# Patient Record
Sex: Female | Born: 1970 | Race: Black or African American | Hispanic: No | Marital: Married | Smoking: Never smoker
Health system: Southern US, Community
[De-identification: ages and names within clinical notes are randomized; demographics above are authoritative.]

---

## 2017-11-12 ENCOUNTER — Emergency Department (HOSPITAL_COMMUNITY): Payer: BLUE CROSS/BLUE SHIELD

## 2017-11-12 ENCOUNTER — Other Ambulatory Visit: Payer: Self-pay

## 2017-11-12 ENCOUNTER — Inpatient Hospital Stay (HOSPITAL_COMMUNITY)
Admission: EM | Admit: 2017-11-12 | Discharge: 2017-11-16 | DRG: 084 | Disposition: A | Discharge status: Home / Self Care | Payer: BLUE CROSS/BLUE SHIELD | Attending: Neurosurgery | Admitting: Neurosurgery

## 2017-11-12 DIAGNOSIS — R55 Syncope and collapse: Secondary | ICD-10-CM | POA: Diagnosis not present

## 2017-11-12 DIAGNOSIS — S06369A Traumatic hemorrhage of cerebrum, unspecified, with loss of consciousness of unspecified duration, initial encounter: Secondary | ICD-10-CM | POA: Diagnosis not present

## 2017-11-12 DIAGNOSIS — I629 Nontraumatic intracranial hemorrhage, unspecified: Secondary | ICD-10-CM | POA: Diagnosis not present

## 2017-11-12 DIAGNOSIS — S06359A Traumatic hemorrhage of left cerebrum with loss of consciousness of unspecified duration, initial encounter: Secondary | ICD-10-CM | POA: Diagnosis not present

## 2017-11-12 DIAGNOSIS — R402142 Coma scale, eyes open, spontaneous, at arrival to emergency department: Secondary | ICD-10-CM | POA: Diagnosis present

## 2017-11-12 DIAGNOSIS — R42 Dizziness and giddiness: Secondary | ICD-10-CM | POA: Diagnosis not present

## 2017-11-12 DIAGNOSIS — W19XXXA Unspecified fall, initial encounter: Secondary | ICD-10-CM | POA: Diagnosis not present

## 2017-11-12 DIAGNOSIS — R402362 Coma scale, best motor response, obeys commands, at arrival to emergency department: Secondary | ICD-10-CM | POA: Diagnosis not present

## 2017-11-12 DIAGNOSIS — R402252 Coma scale, best verbal response, oriented, at arrival to emergency department: Secondary | ICD-10-CM | POA: Diagnosis not present

## 2017-11-12 DIAGNOSIS — I10 Essential (primary) hypertension: Secondary | ICD-10-CM | POA: Diagnosis not present

## 2017-11-12 DIAGNOSIS — S06309A Unspecified focal traumatic brain injury with loss of consciousness of unspecified duration, initial encounter: Principal | ICD-10-CM | POA: Diagnosis present

## 2017-11-12 DIAGNOSIS — S199XXA Unspecified injury of neck, initial encounter: Secondary | ICD-10-CM | POA: Diagnosis not present

## 2017-11-12 DIAGNOSIS — S0003XA Contusion of scalp, initial encounter: Secondary | ICD-10-CM | POA: Diagnosis not present

## 2017-11-12 DIAGNOSIS — R4020 Unspecified coma: Secondary | ICD-10-CM | POA: Diagnosis not present

## 2017-11-12 DIAGNOSIS — I6200 Nontraumatic subdural hemorrhage, unspecified: Secondary | ICD-10-CM | POA: Diagnosis not present

## 2017-11-12 DIAGNOSIS — R5383 Other fatigue: Secondary | ICD-10-CM | POA: Diagnosis not present

## 2017-11-12 LAB — CBC WITH DIFFERENTIAL/PLATELET
Abs Immature Granulocytes: 0.03 10*3/uL (ref 0.00–0.07)
BASOS ABS: 0.1 10*3/uL (ref 0.0–0.1)
Basophils Relative: 0 %
EOS ABS: 0 10*3/uL (ref 0.0–0.5)
Eosinophils Relative: 0 %
HEMATOCRIT: 41.9 % (ref 36.0–46.0)
HEMOGLOBIN: 13.7 g/dL (ref 12.0–15.0)
IMMATURE GRANULOCYTES: 0 %
LYMPHS ABS: 1.5 10*3/uL (ref 0.7–4.0)
LYMPHS PCT: 12 %
MCH: 28.5 pg (ref 26.0–34.0)
MCHC: 32.7 g/dL (ref 30.0–36.0)
MCV: 87.1 fL (ref 80.0–100.0)
Monocytes Absolute: 0.6 10*3/uL (ref 0.1–1.0)
Monocytes Relative: 5 %
NEUTROS PCT: 83 %
Neutro Abs: 9.6 10*3/uL — ABNORMAL HIGH (ref 1.7–7.7)
Platelets: 169 10*3/uL (ref 150–400)
RBC: 4.81 MIL/uL (ref 3.87–5.11)
RDW: 13.5 % (ref 11.5–15.5)
WBC: 11.8 10*3/uL — ABNORMAL HIGH (ref 4.0–10.5)
nRBC: 0 % (ref 0.0–0.2)

## 2017-11-12 LAB — BASIC METABOLIC PANEL
ANION GAP: 10 (ref 5–15)
BUN: 14 mg/dL (ref 6–20)
CHLORIDE: 104 mmol/L (ref 98–111)
CO2: 21 mmol/L — AB (ref 22–32)
Calcium: 8.9 mg/dL (ref 8.9–10.3)
Creatinine, Ser: 0.88 mg/dL (ref 0.44–1.00)
GFR calc non Af Amer: >60 mL/min (ref >60)
Glucose, Bld: 107 mg/dL — ABNORMAL HIGH (ref 70–99)
POTASSIUM: 3.7 mmol/L (ref 3.5–5.1)
Sodium: 135 mmol/L (ref 135–145)

## 2017-11-12 LAB — CBG MONITORING, ED: Glucose-Capillary: 106 mg/dL — ABNORMAL HIGH (ref 70–99)

## 2017-11-12 LAB — I-STAT BETA HCG BLOOD, ED (MC, WL, AP ONLY): I-stat hCG, quantitative: <5 m[IU]/mL (ref <5)

## 2017-11-12 MED ORDER — ONDANSETRON HCL 4 MG/2ML IJ SOLN: 4.0000 mg | Freq: Once | INTRAMUSCULAR | Status: DC

## 2017-11-12 MED ORDER — SODIUM CHLORIDE 0.9 % IV SOLN
INTRAVENOUS | Status: DC
  Administered 2017-11-13: 01:00:00 via INTRAVENOUS
  Filled 2017-11-12 (×3): qty 1000

## 2017-11-12 MED ORDER — DIPHENHYDRAMINE HCL 50 MG/ML IJ SOLN: 25.0000 mg | Freq: Once | INTRAMUSCULAR | Status: DC

## 2017-11-12 MED ORDER — HYDROCODONE-ACETAMINOPHEN 5-325 MG PO TABS: 1.0000 | ORAL_TABLET | ORAL | Status: DC | PRN

## 2017-11-12 MED ORDER — LORAZEPAM 2 MG/ML IJ SOLN
0.5000 mg | Freq: Once | INTRAMUSCULAR | Status: AC
  Administered 2017-11-12: 0.5 mg via INTRAVENOUS
  Filled 2017-11-12: qty 1

## 2017-11-12 MED ORDER — DIPHENHYDRAMINE HCL 50 MG/ML IJ SOLN
12.5000 mg | Freq: Once | INTRAMUSCULAR | Status: AC
  Administered 2017-11-12: 12.5 mg via INTRAVENOUS
  Filled 2017-11-12: qty 1

## 2017-11-12 MED ORDER — METOCLOPRAMIDE HCL 5 MG/ML IJ SOLN
5.0000 mg | Freq: Once | INTRAMUSCULAR | Status: AC
  Administered 2017-11-12: 5 mg via INTRAVENOUS
  Filled 2017-11-12: qty 2

## 2017-11-12 MED ORDER — ACETAMINOPHEN 325 MG PO TABS
650.0000 mg | ORAL_TABLET | ORAL | Status: DC | PRN
  Administered 2017-11-15 (×3): 650 mg via ORAL
  Filled 2017-11-12 (×3): qty 2

## 2017-11-12 MED ORDER — PROCHLORPERAZINE EDISYLATE 10 MG/2ML IJ SOLN: 10.0000 mg | Freq: Once | INTRAMUSCULAR | Status: DC

## 2017-11-12 MED ORDER — LACTATED RINGERS IV BOLUS
1000.0000 mL | Freq: Once | INTRAVENOUS | Status: AC
  Administered 2017-11-12: 1000 mL via INTRAVENOUS

## 2017-11-12 MED ORDER — ONDANSETRON HCL 4 MG PO TABS: 4.0000 mg | ORAL_TABLET | Freq: Four times a day (QID) | ORAL | Status: DC | PRN

## 2017-11-12 MED ORDER — ONDANSETRON HCL 4 MG/2ML IJ SOLN: 4.0000 mg | Freq: Four times a day (QID) | INTRAMUSCULAR | Status: DC | PRN

## 2017-11-12 NOTE — ED Triage Notes (Signed)
Pt brought in by EMS due having a fall at home. Pt was outside doing yard work. Pt did have LOC. Pt c/o head pain on left side. Pt a&ox4. Pt denies neck/back pain.

## 2017-11-12 NOTE — H&P (Signed)
Kristin Yoder is an 47 y.o. female.   Chief Complaint: bilateral frontal contusions status post fall HPI: whom while having a conversation in her garage simply passed out striking the back of her head. She did lose consciousness after the fall. The entire episode was witnessed by her husband. No seizure activity noted.   No past medical history on file.   No family history on file. Social History:  has no tobacco, alcohol, and drug history on file.  Allergies: No Known Allergies   (Not in a hospital admission)  Results for orders placed or performed during the hospital encounter of 11/12/17 (from the past 48 hour(s))  Basic metabolic panel     Status: Abnormal   Collection Time: 11/12/17  5:30 PM  Result Value Ref Range   Sodium 135 135 - 145 mmol/L   Potassium 3.7 3.5 - 5.1 mmol/L   Chloride 104 98 - 111 mmol/L   CO2 21 (L) 22 - 32 mmol/L   Glucose, Bld 107 (H) 70 - 99 mg/dL   BUN 14 6 - 20 mg/dL   Creatinine, Ser 0.88 0.44 - 1.00 mg/dL   Calcium 8.9 8.9 - 10.3 mg/dL   GFR calc non Af Amer >60 >60 mL/min   GFR calc Af Amer >60 >60 mL/min    Comment: (NOTE) The eGFR has been calculated using the CKD EPI equation. This calculation has not been validated in all clinical situations. eGFR's persistently <60 mL/min signify possible Chronic Kidney Disease.    Anion gap 10 5 - 15    Comment: Performed at [facility] [address] [place], [place] [address]  CBC WITH DIFFERENTIAL     Status: Abnormal   Collection Time: 11/12/17  5:30 PM  Result Value Ref Range   WBC 11.8 (H) 4.0 - 10.5 K/uL   RBC 4.81 3.87 - 5.11 MIL/uL   Hemoglobin 13.7 12.0 - 15.0 g/dL   HCT 41.9 36.0 - 46.0 %   MCV 87.1 80.0 - 100.0 fL   MCH 28.5 26.0 - 34.0 pg   MCHC 32.7 30.0 - 36.0 g/dL   RDW 13.5 11.5 - 15.5 %   Platelets 169 150 - 400 K/uL   nRBC 0.0 0.0 - 0.2 %   Neutrophils Relative % 83 %   Neutro Abs 9.6 (H) 1.7 - 7.7 K/uL   Lymphocytes Relative 12 %   Lymphs Abs 1.5 0.7 - 4.0 K/uL    Monocytes Relative 5 %   Monocytes Absolute 0.6 0.1 - 1.0 K/uL   Eosinophils Relative 0 %   Eosinophils Absolute 0.0 0.0 - 0.5 K/uL   Basophils Relative 0 %   Basophils Absolute 0.1 0.0 - 0.1 K/uL   Immature Granulocytes 0 %   Abs Immature Granulocytes 0.03 0.00 - 0.07 K/uL    Comment: Performed at [facility] [address], [place], [place] [address]  CBG monitoring, ED     Status: Abnormal   Collection Time: 11/12/17  5:31 PM  Result Value Ref Range   Glucose-Capillary 106 (H) 70 - 99 mg/dL   Comment 1 Notify RN    Comment 2 Document in Chart   I-Stat beta hCG blood, ED     Status: None   Collection Time: 11/12/17  5:36 PM  Result Value Ref Range   I-stat hCG, quantitative <5.0 <5 mIU/mL   Comment 3            Comment:   GEST. AGE  CONC.  (mIU/mL)   <=1 WEEK        5 - 50     2 WEEKS       50 - 500     3 WEEKS       100 - 10,000     4 WEEKS     1,000 - 30,000        FEMALE AND NON-PREGNANT FEMALE:     LESS THAN 5 mIU/mL    Ct Head Wo Contrast  Result Date: 11/12/2017 CLINICAL DATA:  Syncopal episode, fall with head injury. C-collar in place. EXAM: CT HEAD WITHOUT CONTRAST CT CERVICAL SPINE WITHOUT CONTRAST TECHNIQUE: Multidetector CT imaging of the head and cervical spine was performed following the standard protocol without intravenous contrast. Multiplanar CT image reconstructions of the cervical spine were also generated. COMPARISON:  None. FINDINGS: CT HEAD FINDINGS Brain: Acute parenchymal hemorrhage within the lower aspects of the LEFT frontal lobe, consistent with hemorrhagic contusion, dominant component measuring 2 cm greatest dimension. Smaller amount of acute parenchymal hemorrhage within the lower portion of the RIGHT frontal lobe. Associated small amount of subarachnoid hemorrhage within the frontal lobes, again likely posttraumatic. Thin extra-axial hemorrhage is seen along the adjacent anterior falx, measuring no more than 3 mm greatest thickness, without  associated mass effect. At least mild associated edema within the lower frontal lobes bilaterally, suspected to be most significant within the inferior portion of the RIGHT frontal lobe, but no midline shift seen. Ventricles are within normal limits in size and configuration. Vascular: No hyperdense vessel or unexpected calcification. Skull: No osseous fracture or dislocation. Sinuses/Orbits: No acute finding. Other: Scalp edema/hematoma overlying the LEFT occipital bone, extending to the midline. No underlying fracture identified. CT CERVICAL SPINE FINDINGS Alignment: Normal.  No vertebral body subluxation. Skull base and vertebrae: No fracture line or displaced fracture fragment seen. Facet joints appear intact and normally aligned throughout. Soft tissues and spinal canal: No prevertebral fluid or swelling. No visible canal hematoma. Disc levels:  Disc spaces are well preserved throughout. Upper chest: Negative. Other: None. IMPRESSION: 1. Hemorrhagic contusions within the lower bifrontal lobes, largest component within the lower LEFT frontal lobe measuring 2 cm, with associated small amount of traumatic subarachnoid hemorrhage. Probable associated parenchymal edema, but no midline shift or herniation. 2. Thin extra-axial hemorrhage along the adjacent anterior falx, likely subdural, measuring 3 mm greatest thickness, without significant mass effect. 3. Scalp edema/hematoma overlying the LEFT occipital bone, extending to the midline. No underlying skull fracture identified. The intracranial hemorrhage, therefore, is indicative of contrecoup injury. 4. No fracture or acute subluxation within the cervical spine. Critical Value/emergent results were called by telephone at the time of interpretation on 11/12/2017 at 7:22 pm to Dr. Zenia Resides, who verbally acknowledged these results. Electronically Signed   By: Franki Cabot M.D.   On: 11/12/2017 19:25   Ct Cervical Spine Wo Contrast  Result Date: 11/12/2017 CLINICAL  DATA:  Syncopal episode, fall with head injury. C-collar in place. EXAM: CT HEAD WITHOUT CONTRAST CT CERVICAL SPINE WITHOUT CONTRAST TECHNIQUE: Multidetector CT imaging of the head and cervical spine was performed following the standard protocol without intravenous contrast. Multiplanar CT image reconstructions of the cervical spine were also generated. COMPARISON:  None. FINDINGS: CT HEAD FINDINGS Brain: Acute parenchymal hemorrhage within the lower aspects of the LEFT frontal lobe, consistent with hemorrhagic contusion, dominant component measuring 2 cm greatest dimension. Smaller amount of acute parenchymal hemorrhage within the lower portion of the RIGHT frontal lobe. Associated small  amount of subarachnoid hemorrhage within the frontal lobes, again likely posttraumatic. Thin extra-axial hemorrhage is seen along the adjacent anterior falx, measuring no more than 3 mm greatest thickness, without associated mass effect. At least mild associated edema within the lower frontal lobes bilaterally, suspected to be most significant within the inferior portion of the RIGHT frontal lobe, but no midline shift seen. Ventricles are within normal limits in size and configuration. Vascular: No hyperdense vessel or unexpected calcification. Skull: No osseous fracture or dislocation. Sinuses/Orbits: No acute finding. Other: Scalp edema/hematoma overlying the LEFT occipital bone, extending to the midline. No underlying fracture identified. CT CERVICAL SPINE FINDINGS Alignment: Normal.  No vertebral body subluxation. Skull base and vertebrae: No fracture line or displaced fracture fragment seen. Facet joints appear intact and normally aligned throughout. Soft tissues and spinal canal: No prevertebral fluid or swelling. No visible canal hematoma. Disc levels:  Disc spaces are well preserved throughout. Upper chest: Negative. Other: None. IMPRESSION: 1. Hemorrhagic contusions within the lower bifrontal lobes, largest component  within the lower LEFT frontal lobe measuring 2 cm, with associated small amount of traumatic subarachnoid hemorrhage. Probable associated parenchymal edema, but no midline shift or herniation. 2. Thin extra-axial hemorrhage along the adjacent anterior falx, likely subdural, measuring 3 mm greatest thickness, without significant mass effect. 3. Scalp edema/hematoma overlying the LEFT occipital bone, extending to the midline. No underlying skull fracture identified. The intracranial hemorrhage, therefore, is indicative of contrecoup injury. 4. No fracture or acute subluxation within the cervical spine. Critical Value/emergent results were called by telephone at the time of interpretation on 11/12/2017 at 7:22 pm to Dr. Zenia Resides, who verbally acknowledged these results. Electronically Signed   By: Franki Cabot M.D.   On: 11/12/2017 19:25    Review of Systems  Constitutional: Negative.   HENT: Negative.   Eyes: Negative.   Respiratory: Negative.   Cardiovascular: Negative.   Gastrointestinal: Negative.   Genitourinary: Negative.   Musculoskeletal: Negative.   Skin: Negative.   Neurological: Positive for loss of consciousness.  Endo/Heme/Allergies: Negative.   Psychiatric/Behavioral: Negative.     Blood pressure (!) 144/84, pulse 76, temperature 97.7 F (36.5 C), temperature source Oral, resp. rate 18, SpO2 98 %. Physical Exam  Constitutional: She is oriented to person, place, and time. She appears well-developed and well-nourished. She appears distressed.  HENT:  Right Ear: External ear normal.  Left Ear: External ear normal.  Nose: Nose normal.  Mouth/Throat: Oropharynx is clear and moist. No oropharyngeal exudate.  Scalp edema  Eyes: Conjunctivae and EOM are normal.  Neck: Normal range of motion. Neck supple.  Cardiovascular: Normal rate, regular rhythm, normal heart sounds and intact distal pulses.  Respiratory: Effort normal and breath sounds normal.  GI: Soft.  Musculoskeletal: Normal  range of motion.  Neurological: She is alert and oriented to person, place, and time. She has normal strength and normal reflexes. No cranial nerve deficit or sensory deficit. GCS eye subscore is 4. GCS verbal subscore is 5. GCS motor subscore is 6. She displays no Babinski's sign on the right side. She displays no Babinski's sign on the left side.  Gait not assessed     Assessment/Plan Zayna Toste is a 47 y.o. female Whom will be admitted for observation due to the hemorrhagic contusions. Will repeat ct in the am.   Joane Postel L, MD 11/12/2017, 10:46 PM

## 2017-11-12 NOTE — ED Triage Notes (Signed)
Aspen collar on

## 2017-11-12 NOTE — ED Provider Notes (Signed)
I saw and evaluated the patient, reviewed the resident's note and I agree with the findings and plan.  EKG: None 47 year old female here after having a syncopal event just prior to arrival.  Patient was standing up became lightheaded and dizzy and fell backwards and struck her head.  Brief loss of consciousness.  Complains of occipital headache at this time.  No confusion or mental status changes.  No emesis.  Awaiting CT of head and cervical spine as well as laboratory studies.   Lorre Nick, MD 11/12/17 1731

## 2017-11-12 NOTE — ED Provider Notes (Signed)
[facility] EMERGENCY DEPARTMENT Provider Note   CSN: [id number] Arrival date & time: 11/12/17  1609     History   Chief Complaint Chief Complaint  Patient presents with  . Fall    HPI Kristin Yoder is a 47 y.o. female.  HPI   47 year old female with no pertinent PMH presents status post syncopal episode with head injury.  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  Patient became dizzy described as lightheadedness associated with mild nausea, denies tunnel vision, denies chest pain or extremity numbness or tingling or weakness.  Patient then blacked out falling backwards unassisted striking head on concrete.  Patient had positive LOC for less than 1 minute.  Husband to follow-up with a fall at her side, immediately started doing chest compressions which patient started to move around after several seconds.  EMS was called found patient with a pulse and [facility] to [facility] with a rolled towel c-collar.  Patient endorses headache, moderate, frontal and occipital, nothing makes better, nothing makes it worse, denies blurry vision, endorses nausea, has not had emesis.  Patient does not remember the event.  Denies neck pain.  Otherwise without complaint.  No past medical history on file.  Patient Active Problem List   Diagnosis Date Noted  . Intracranial hemorrhage following injury with loss of consciousness, initial encounter (HCC) 11/12/2017     The histories are not reviewed yet. Please review them in the "History" navigator section and refresh this SmartLink.   OB History   None      Home Medications    Prior to Admission medications   Not on File    Family History No family history on file.  Social History Social History   Tobacco Use  . Smoking status: Not on file  Substance Use Topics  . Alcohol use: Not on file  . Drug use:  Not on file     Allergies   Patient has no known allergies.   Review of Systems Review of Systems  Constitutional: Negative for chills and fever.  HENT: Negative for ear pain and sore throat.   Eyes: Negative for pain and visual disturbance.  Respiratory: Negative for cough and shortness of breath.   Cardiovascular: Negative for chest pain and palpitations.  Gastrointestinal: Negative for abdominal pain and vomiting.  Genitourinary: Negative for dysuria and hematuria.  Musculoskeletal: Negative for arthralgias and back pain.  Skin: Negative for color change and rash.  Neurological: Positive for dizziness and headaches. Negative for seizures and syncope.  All other systems reviewed and are negative.    Physical Exam Updated Vital Signs BP (!) 144/84   Pulse 76   Temp 97.7 F (36.5 C) (Oral)   Resp 18   SpO2 98%   Physical Exam  Constitutional: She is oriented to person, place, and time. She appears well-developed and well-nourished. No distress.  HENT:  Head: Normocephalic and atraumatic.    Eyes: Conjunctivae are normal.  Neck: Neck supple.  Cardiovascular: Normal rate and regular rhythm.  No murmur heard. Pulmonary/Chest: Effort normal and breath sounds normal. No respiratory distress.  Abdominal: Soft. There is no tenderness.  Musculoskeletal: She exhibits no edema.  Neurological: She is alert and oriented to person, place, and time. GCS eye subscore is 4. GCS verbal subscore is 5. GCS motor subscore is 6.  PERRLA, CN II-XII intact, 5/5 in bilateral upper  and lower extremity with normal station throughout, normal finger-to-nose bilaterally.  Skin: Skin is warm and dry.  Psychiatric: She has a normal mood and affect.  Nursing note and vitals reviewed.    ED Treatments / Results  Labs (all labs ordered are listed, but only abnormal results are displayed) Labs Reviewed  BASIC METABOLIC PANEL - Abnormal; Notable for the following components:      Result Value     CO2 21 (*)    Glucose, Bld 107 (*)    All other components within normal limits  CBC WITH DIFFERENTIAL/PLATELET - Abnormal; Notable for the following components:   WBC 11.8 (*)    Neutro Abs 9.6 (*)    All other components within normal limits  CBG MONITORING, ED - Abnormal; Notable for the following components:   Glucose-Capillary 106 (*)    All other components within normal limits  HIV ANTIBODY (ROUTINE TESTING W REFLEX)  SODIUM  I-STAT BETA HCG BLOOD, ED ([facility], [facility], [facility] ONLY)    EKG EKG Interpretation  Date/Time:  Saturday November 12 2017 17:38:11 EDT Ventricular Rate:  68 PR Interval:    QRS Duration: 109 QT Interval:  397 QTC Calculation: 423 R Axis:   -3 Text Interpretation:  Sinus rhythm Low voltage, precordial leads Confirmed by Lorre Nick ([id number]) on 11/12/2017 6:41:42 PM   Radiology Ct Head Wo Contrast  Result Date: 11/12/2017 CLINICAL DATA:  Syncopal episode, fall with head injury. C-collar in place. EXAM: CT HEAD WITHOUT CONTRAST CT CERVICAL SPINE WITHOUT CONTRAST TECHNIQUE: Multidetector CT imaging of the head and cervical spine was performed following the standard protocol without intravenous contrast. Multiplanar CT image reconstructions of the cervical spine were also generated. COMPARISON:  None. FINDINGS: CT HEAD FINDINGS Brain: Acute parenchymal hemorrhage within the lower aspects of the LEFT frontal lobe, consistent with hemorrhagic contusion, dominant component measuring 2 cm greatest dimension. Smaller amount of acute parenchymal hemorrhage within the lower portion of the RIGHT frontal lobe. Associated small amount of subarachnoid hemorrhage within the frontal lobes, again likely posttraumatic. Thin extra-axial hemorrhage is seen along the adjacent anterior falx, measuring no more than 3 mm greatest thickness, without associated mass effect. At least mild associated edema within the lower frontal lobes bilaterally, suspected to be most significant within the  inferior portion of the RIGHT frontal lobe, but no midline shift seen. Ventricles are within normal limits in size and configuration. Vascular: No hyperdense vessel or unexpected calcification. Skull: No osseous fracture or dislocation. Sinuses/Orbits: No acute finding. Other: Scalp edema/hematoma overlying the LEFT occipital bone, extending to the midline. No underlying fracture identified. CT CERVICAL SPINE FINDINGS Alignment: Normal.  No vertebral body subluxation. Skull base and vertebrae: No fracture line or displaced fracture fragment seen. Facet joints appear intact and normally aligned throughout. Soft tissues and spinal canal: No prevertebral fluid or swelling. No visible canal hematoma. Disc levels:  Disc spaces are well preserved throughout. Upper chest: Negative. Other: None. IMPRESSION: 1. Hemorrhagic contusions within the lower bifrontal lobes, largest component within the lower LEFT frontal lobe measuring 2 cm, with associated small amount of traumatic subarachnoid hemorrhage. Probable associated parenchymal edema, but no midline shift or herniation. 2. Thin extra-axial hemorrhage along the adjacent anterior falx, likely subdural, measuring 3 mm greatest thickness, without significant mass effect. 3. Scalp edema/hematoma overlying the LEFT occipital bone, extending to the midline. No underlying skull fracture identified. The intracranial hemorrhage, therefore, is indicative of contrecoup injury. 4. No fracture or acute subluxation within the cervical spine. Critical Value/emergent  results were called by telephone at the time of interpretation on 11/12/2017 at 7:22 pm to Dr. Freida Busman, who verbally acknowledged these results. Electronically Signed   By: Bary Richard M.D.   On: 11/12/2017 19:25   Ct Cervical Spine Wo Contrast  Result Date: 11/12/2017 CLINICAL DATA:  Syncopal episode, fall with head injury. C-collar in place. EXAM: CT HEAD WITHOUT CONTRAST CT CERVICAL SPINE WITHOUT CONTRAST TECHNIQUE:  Multidetector CT imaging of the head and cervical spine was performed following the standard protocol without intravenous contrast. Multiplanar CT image reconstructions of the cervical spine were also generated. COMPARISON:  None. FINDINGS: CT HEAD FINDINGS Brain: Acute parenchymal hemorrhage within the lower aspects of the LEFT frontal lobe, consistent with hemorrhagic contusion, dominant component measuring 2 cm greatest dimension. Smaller amount of acute parenchymal hemorrhage within the lower portion of the RIGHT frontal lobe. Associated small amount of subarachnoid hemorrhage within the frontal lobes, again likely posttraumatic. Thin extra-axial hemorrhage is seen along the adjacent anterior falx, measuring no more than 3 mm greatest thickness, without associated mass effect. At least mild associated edema within the lower frontal lobes bilaterally, suspected to be most significant within the inferior portion of the RIGHT frontal lobe, but no midline shift seen. Ventricles are within normal limits in size and configuration. Vascular: No hyperdense vessel or unexpected calcification. Skull: No osseous fracture or dislocation. Sinuses/Orbits: No acute finding. Other: Scalp edema/hematoma overlying the LEFT occipital bone, extending to the midline. No underlying fracture identified. CT CERVICAL SPINE FINDINGS Alignment: Normal.  No vertebral body subluxation. Skull base and vertebrae: No fracture line or displaced fracture fragment seen. Facet joints appear intact and normally aligned throughout. Soft tissues and spinal canal: No prevertebral fluid or swelling. No visible canal hematoma. Disc levels:  Disc spaces are well preserved throughout. Upper chest: Negative. Other: None. IMPRESSION: 1. Hemorrhagic contusions within the lower bifrontal lobes, largest component within the lower LEFT frontal lobe measuring 2 cm, with associated small amount of traumatic subarachnoid hemorrhage. Probable associated parenchymal  edema, but no midline shift or herniation. 2. Thin extra-axial hemorrhage along the adjacent anterior falx, likely subdural, measuring 3 mm greatest thickness, without significant mass effect. 3. Scalp edema/hematoma overlying the LEFT occipital bone, extending to the midline. No underlying skull fracture identified. The intracranial hemorrhage, therefore, is indicative of contrecoup injury. 4. No fracture or acute subluxation within the cervical spine. Critical Value/emergent results were called by telephone at the time of interpretation on 11/12/2017 at 7:22 pm to Dr. Freida Busman, who verbally acknowledged these results. Electronically Signed   By: Bary Richard M.D.   On: 11/12/2017 19:25    Procedures Procedures (including critical care time)  Medications Ordered in ED Medications  sodium chloride 0.9 % 1,000 mL with potassium chloride 20 mEq infusion (has no administration in time range)  acetaminophen (TYLENOL) tablet 650 mg (has no administration in time range)  HYDROcodone-acetaminophen (NORCO/VICODIN) 5-325 MG per tablet 1 tablet (has no administration in time range)  ondansetron (ZOFRAN) tablet 4 mg (has no administration in time range)    Or  ondansetron (ZOFRAN) injection 4 mg (has no administration in time range)  lactated ringers bolus 1,000 mL (0 mLs Intravenous Stopped 11/12/17 1951)  metoCLOPramide (REGLAN) injection 5 mg (5 mg Intravenous Given 11/12/17 1744)  diphenhydrAMINE (BENADRYL) injection 12.5 mg (12.5 mg Intravenous Given 11/12/17 1744)  LORazepam (ATIVAN) injection 0.5 mg (0.5 mg Intravenous Given 11/12/17 1744)     Initial Impression / Assessment and Plan / ED Course  I have  reviewed the triage vital signs and the nursing notes.  Pertinent labs & imaging results that were available during my care of the patient were reviewed by me and considered in my medical decision making (see chart for details).     47 year old female with no pertinent PMH presents status post  syncopal episode with head injury.  History as above.  DDX includes vasovagal versus cardiac versus neurogenic syncope.  Patient with potential for intracranial bleed status post fall.  Patient does not take blood thinners.  Patient with likely concussion.   Initial vitas mildly tachypneic and hypertensive.  Not hypoxic.Marland Kitchen  Patient given migraine cocktail for headache.  Patient placed in [place] c-collar.  Labs and imaging performed which revealed acidosis with 0.8, CO2 21.  CT head/neck reveals Hemorrhagic contusions within the lower bifrontal lobes, largest component within the lower LEFT frontal lobe measuring 2 cm, with associated small amount of traumatic subarachnoid hemorrhage. Probable associated parenchymal edema, but no midline shift or Herniation.  Patient given Zofran for nausea.  Continues to have normal neurologic exam on reevaluation.  Neurosurgery consulted.  Patient to be admitted to neurosurgery for management of IPH.  Patient seen in conjunction with my attending Dr. Freida Busman who agrees with plan of disposition.  Final Clinical Impressions(s) / ED Diagnoses   Final diagnoses:  Fall, initial encounter  Intracranial hemorrhage following injury, with loss of consciousness, initial encounter [facility])    ED Discharge Orders    None       Margit Banda, MD 11/12/17 2231    Lorre Nick, MD 11/13/17 2228

## 2017-11-13 ENCOUNTER — Inpatient Hospital Stay (HOSPITAL_COMMUNITY): Payer: BLUE CROSS/BLUE SHIELD

## 2017-11-13 LAB — MRSA PCR SCREENING: MRSA BY PCR: NEGATIVE

## 2017-11-13 LAB — SODIUM: Sodium: 137 mmol/L (ref 135–145)

## 2017-11-13 LAB — HIV ANTIBODY (ROUTINE TESTING W REFLEX): HIV Screen 4th Generation wRfx: NONREACTIVE

## 2017-11-13 MED ORDER — POTASSIUM CHLORIDE IN NACL 20-0.9 MEQ/L-% IV SOLN
INTRAVENOUS | Status: DC
  Administered 2017-11-13 – 2017-11-15 (×4): via INTRAVENOUS
  Filled 2017-11-13 (×5): qty 1000

## 2017-11-13 NOTE — Progress Notes (Signed)
PT Cancellation Note  Patient Details Name: Daje Stark MRN: [id number] DOB: Feb 07, 1970   Cancelled Treatment:    Reason Eval/Treat Not Completed: Patient not medically ready  Noted today's CT scan shows incr size of hemorrhage and midline shift. Spoke with Ashok Cordia, RN and agreed to wait until MD sees pt prior to PT evaluation.  Will follow-up later today.  Sherian Rein Pager  [phone] 11/13/2017, 8:58 AM

## 2017-11-13 NOTE — Progress Notes (Signed)
PT Cancellation Note  Patient Details Name: Kristin Yoder MRN: [id number] DOB: 09/14/1970   Cancelled Treatment:    Reason Eval/Treat Not Completed: Medical issues which prohibited therapy  RN reports Dr. Franky Macho has come by, but she did not get to address whether to proceed with PT evaluation and if still has bedrest with bathroom privileges only.  Scherrie November Larenzo Caples, PT 11/13/2017, 1:58 PM

## 2017-11-13 NOTE — Evaluation (Signed)
Speech Language Pathology Evaluation Patient Details Name: Kristin Yoder MRN: [id number] DOB: 24-Oct-1970 Today's Date: 11/13/2017 Time: 4098-1191 SLP Time Calculation (min) (ACUTE ONLY): 26 min  Problem List:  Patient Active Problem List   Diagnosis Date Noted  . Intracranial hemorrhage following injury with loss of consciousness, initial encounter (HCC) 11/12/2017   Past Medical History: No past medical history on file. Past Surgical History: The histories are not reviewed yet. Please review them in the "History" navigator section and refresh this SmartLink. HPI:  47 year old female with no pertinent PMH presents status post syncopal episode with head injury.  Patient was working outside for roughly 3 hours in the yard and proceeded to have a 1 hour standing consultation with a vendor from [employer].  Patient became dizzy described as lightheadedness associated with mild nausea, denies tunnel vision, denies chest pain or extremity numbness or tingling or weakness.  Patient then blacked out falling backwards unassisted striking head on concrete.  Patient had positive LOC for less than 1 minute.  Husband to follow-up with a fall at her side, immediately started doing chest compressions which patient started to move around after several seconds.  EMS was called found patient with a pulse and transferred patient to Medical Center with a rolled towel c-collar.  Patient endorses headache, moderate, frontal and occipital, nothing makes better, nothing makes it worse, denies blurry vision, endorses nausea, has not had emesis.  Patient does not remember the event.  Denies neck pain.  Otherwise without complaint.  Initial head CT was showing hemorrhagic contusin in the lower bilateral frontal lobes with associated small amount of subarachnoid hemorrhage, probable parenchymal edema iwthout midline shift and  thin extra axial hemorrhage along adjacent anterior falx likely subdural.  Most recent CT head is showing  slightly increased size of hemorrhagic contusions at frontal poles with midline shift of 4mm.     Assessment / Plan / Recommendation Clinical Impression  Cognitive/linguistic and motor speech screen was completed.  Cranial nerve exam was unremarkable.  All oral/facial structures appeared to be adequate.  Speech was clear but vocal volume was low which impeded intelligiblity at times.  It did not appear to be related to dysarthria or apraxia.  The patient achieved a score of 27/30 on the Mini Mental State Exam suggesting functional skills.  She was oriented to person,  place and time.  She was disoriented to situation.  She had good immediate and delayed recall and attention to task was good.  She was able to name objects, repeat a short sentence, follow 2 parts of a 3 step command, read/comprehend a short sentence, write a short sentence and copy a design.  In addition, she was able to accurately complete a clock drawing task and provide logical solutions to simple problems.  Patient does not appear to ahve acute ST needs at this time.  Please re-consult if we can be of further assistance.      SLP Assessment  SLP Recommendation/Assessment: Patient does not need any further Speech Lanaguage Pathology Services SLP Visit Diagnosis: Frontal lobe and executive function deficit    Follow Up Recommendations  None          SLP Evaluation Cognition  Overall Cognitive Status: Within Functional Limits for tasks assessed Arousal/Alertness: Awake/alert Orientation Level: Oriented X4 Attention: Sustained Sustained Attention: Appears intact Memory: Appears intact Awareness: Appears intact Problem Solving: Appears intact Safety/Judgment: Appears intact       Comprehension  Auditory Comprehension Overall Auditory Comprehension: Appears within functional limits for  tasks assessed Commands: Within Functional Limits Conversation: Simple Reading Comprehension Reading Status: Within funtional limits     Expression Expression Primary Mode of Expression: Verbal Verbal Expression Overall Verbal Expression: Appears within functional limits for tasks assessed Initiation: No impairment Automatic Speech: Name;Social Response Level of Generative/Spontaneous Verbalization: Sentence Repetition: No impairment Naming: No impairment Pragmatics: No impairment Non-Verbal Means of Communication: Not applicable Written Expression Dominant Hand: Right Written Expression: Within Functional Limits   Oral / Motor  Oral Motor/Sensory Function Overall Oral Motor/Sensory Function: Within functional limits Motor Speech Overall Motor Speech: Appears within functional limits for tasks assessed Respiration: Within functional limits Phonation: Normal Resonance: Within functional limits Articulation: Within functional limitis Intelligibility: Intelligible Motor Planning: Witnin functional limits Motor Speech Errors: Not applicable   GO                   Dimas Aguas, MA, CCC-SLP Acute Rehab SLP [phone] Fleet Contras 11/13/2017, 2:41 PM

## 2017-11-13 NOTE — Progress Notes (Signed)
Patient ID: Kristin Yoder, female   DOB: Jan 22, 1971, 47 y.o.   MRN: [id number] BP 137/87   Pulse 70   Temp 99.3 F (37.4 C) (Oral)   Resp 18   Ht 5\' 2"  (1.575 m)   Wt 63.3 kg   SpO2 99%   BMI 25.52 kg/m  Alert, following commands Moving all extremities well Ct shows enlargement of hematoma, which is expected Will remain in icu for observation Results for orders placed or performed during the hospital encounter of 11/12/17 (from the past 24 hour(s))  Basic metabolic panel     Status: Abnormal   Collection Time: 11/12/17  5:30 PM  Result Value Ref Range   Sodium 135 135 - 145 mmol/L   Potassium 3.7 3.5 - 5.1 mmol/L   Chloride 104 98 - 111 mmol/L   CO2 21 (L) 22 - 32 mmol/L   Glucose, Bld 107 (H) 70 - 99 mg/dL   BUN 14 6 - 20 mg/dL   Creatinine, Ser 4.09 0.44 - 1.00 mg/dL   Calcium 8.9 8.9 - 81.1 mg/dL   GFR calc non Af Amer >60 >60 mL/min   GFR calc Af Amer >60 >60 mL/min   Anion gap 10 5 - 15  CBC WITH DIFFERENTIAL     Status: Abnormal   Collection Time: 11/12/17  5:30 PM  Result Value Ref Range   WBC 11.8 (H) 4.0 - 10.5 K/uL   RBC 4.81 3.87 - 5.11 MIL/uL   Hemoglobin 13.7 12.0 - 15.0 g/dL   HCT 91.4 78.2 - 95.6 %   MCV 87.1 80.0 - 100.0 fL   MCH 28.5 26.0 - 34.0 pg   MCHC 32.7 30.0 - 36.0 g/dL   RDW 21.3 08.6 - 57.8 %   Platelets 169 150 - 400 K/uL   nRBC 0.0 0.0 - 0.2 %   Neutrophils Relative % 83 %   Neutro Abs 9.6 (H) 1.7 - 7.7 K/uL   Lymphocytes Relative 12 %   Lymphs Abs 1.5 0.7 - 4.0 K/uL   Monocytes Relative 5 %   Monocytes Absolute 0.6 0.1 - 1.0 K/uL   Eosinophils Relative 0 %   Eosinophils Absolute 0.0 0.0 - 0.5 K/uL   Basophils Relative 0 %   Basophils Absolute 0.1 0.0 - 0.1 K/uL   Immature Granulocytes 0 %   Abs Immature Granulocytes 0.03 0.00 - 0.07 K/uL  CBG monitoring, ED     Status: Abnormal   Collection Time: 11/12/17  5:31 PM  Result Value Ref Range   Glucose-Capillary 106 (H) 70 - 99 mg/dL   Comment 1 Notify RN    Comment 2 Document in Chart    I-Stat beta hCG blood, ED     Status: None   Collection Time: 11/12/17  5:36 PM  Result Value Ref Range   I-stat hCG, quantitative <5.0 <5 mIU/mL   Comment 3          MRSA PCR Screening     Status: None   Collection Time: 11/13/17  2:39 AM  Result Value Ref Range   MRSA by PCR NEGATIVE NEGATIVE  Sodium     Status: None   Collection Time: 11/13/17  5:29 AM  Result Value Ref Range   Sodium 137 135 - 145 mmol/L  na remains stable.

## 2017-11-14 ENCOUNTER — Encounter (HOSPITAL_COMMUNITY): Payer: Self-pay

## 2017-11-14 LAB — SODIUM: Sodium: 138 mmol/L (ref 135–145)

## 2017-11-14 NOTE — Evaluation (Signed)
Physical Therapy Evaluation Patient Details Name: Kristin Yoder MRN: [id number] DOB: 11-05-1970 Today's Date: 11/14/2017   History of Present Illness  Pt is a 47 yo female who fell in her garage and hit the back of her head and loss consciousness. CT of head revealed hemorrhagic contusions within the lower bifrontal lobes, scalp edema/hematoma overlying the L occipital bone, and thin extra-axial hemorrhage alone adjacent anterior falx, likely subdural.  Clinical Impression  Pt with flat affect but able to follow commands and is oriented. Pt transitioned from minA for ambulation initially to min guard over amb of 200' without AD. Acute PT to cont to follow to progress indep and address high level balance.    Follow Up Recommendations No PT follow up;Supervision/Assistance - 24 hour    Equipment Recommendations  None recommended by PT    Recommendations for Other Services       Precautions / Restrictions Precautions Precautions: Fall      Mobility  Bed Mobility Overal bed mobility: Modified Independent             General bed mobility comments: no difficulty  Transfers Overall transfer level: Needs assistance Equipment used: 1 person hand held assist Transfers: Sit to/from Stand Sit to Stand: Min guard;Min assist         General transfer comment: pt initially unsteady requiring minA due to strong lateral sway but improved over session  Ambulation/Gait Ambulation/Gait assistance: Min guard;Supervision Gait Distance (Feet): 200 Feet Assistive device: 1 person hand held assist;None Gait Pattern/deviations: Step-through pattern;Decreased stride length;Narrow base of support Gait velocity: wfl Gait velocity interpretation: 1.31 - 2.62 ft/sec, indicative of limited community ambulator General Gait Details: pt initially unsteady with strong R lateral sway requiring minA due to L LE pain however pt transitioned from 1 person HHA to no device throughout 200' of  ambulation  Stairs Stairs: (deferred due to fatigue per patient)          Wheelchair Mobility    Modified Rankin (Stroke Patients Only) Modified Rankin (Stroke Patients Only) Pre-Morbid Rankin Score: No symptoms Modified Rankin: Slight disability     Balance Overall balance assessment: Mild deficits observed, not formally tested                                           Pertinent Vitals/Pain Pain Assessment: No/denies pain(had pain in L LE with initial ambulation but then diminished)    Home Living Family/patient expects to be discharged to:: Private residence Living Arrangements: Spouse/significant other Available Help at Discharge: Family;Available PRN/intermittently(spouse works, kids 14 and 17, in school) Type of Home: House Home Access: Stairs to enter Entrance Stairs-Rails: None Secretary/administrator of Steps: 1 Home Layout: Two level Home Equipment: None Additional Comments: just moved here from [place], husband here 1 month, wife and kids here 2 weeks    Prior Function Level of Independence: Independent               Hand Dominance   Dominant Hand: Right    Extremity/Trunk Assessment   Upper Extremity Assessment Upper Extremity Assessment: Overall WFL for tasks assessed    Lower Extremity Assessment Lower Extremity Assessment: Overall WFL for tasks assessed    Cervical / Trunk Assessment Cervical / Trunk Assessment: Normal  Communication   Communication: No difficulties  Cognition Arousal/Alertness: Awake/alert Behavior During Therapy: Flat affect Overall Cognitive Status: Within Functional Limits for tasks assessed  General Comments General comments (skin integrity, edema, etc.): pt taken to the bathroom, pt indep with hygiene and hand washing with supervision for balance    Exercises     Assessment/Plan    PT Assessment Patient needs continued PT services   PT Problem List         PT Treatment Interventions DME instruction;Gait training;Stair training;Functional mobility training;Therapeutic activities;Therapeutic exercise;Balance training    PT Goals (Current goals can be found in the Care Plan section)  Acute Rehab PT Goals Patient Stated Goal: home PT Goal Formulation: With patient Time For Goal Achievement: 11/28/17 Potential to Achieve Goals: Good Additional Goals Additional Goal #1: Pt to score >19 on DGI to indicate minimal falls risk.    Frequency Min 3X/week   Barriers to discharge        Co-evaluation               AM-PAC PT "6 Clicks" Daily Activity  Outcome Measure Difficulty turning over in bed (including adjusting bedclothes, sheets and blankets)?: None Difficulty moving from lying on back to sitting on the side of the bed? : None Difficulty sitting down on and standing up from a chair with arms (e.g., wheelchair, bedside commode, etc,.)?: Unable Help needed moving to and from a bed to chair (including a wheelchair)?: A Little Help needed walking in hospital room?: A Little Help needed climbing 3-5 steps with a railing? : A Little 6 Click Score: 18    End of Session Equipment Utilized During Treatment: Gait belt Activity Tolerance: Patient tolerated treatment well Patient left: in chair;with call bell/phone within reach;with nursing/sitter in room;with family/visitor present Nurse Communication: Mobility status PT Visit Diagnosis: Unsteadiness on feet (R26.81)    Time: 9147-8295 PT Time Calculation (min) (ACUTE ONLY): 20 min   Charges:   PT Evaluation $PT Eval Low Complexity: 1 Low          Lewis Shock, PT, DPT Acute Rehabilitation Services Pager #: [phone] Office #: [phone]   Iona Hansen 11/14/2017, 1:06 PM

## 2017-11-14 NOTE — Progress Notes (Signed)
Patient ID: Kristin Yoder, female   DOB: 03/16/1970, 47 y.o.   MRN: [id number] BP (!) 154/81 (BP Location: Left Arm)   Pulse (!) 55   Temp 99.1 F (37.3 C) (Oral)   Resp 16   Ht 5\' 2"  (1.575 m)   Wt 63.3 kg   SpO2 99%   BMI 25.52 kg/m  Alert and oriented x4 Will check cT tomorrow.  Na is normal Moving all extremities

## 2017-11-15 ENCOUNTER — Inpatient Hospital Stay (HOSPITAL_COMMUNITY): Payer: BLUE CROSS/BLUE SHIELD

## 2017-11-15 LAB — SODIUM: Sodium: 138 mmol/L (ref 135–145)

## 2017-11-15 MED ORDER — POTASSIUM CHLORIDE IN NACL 20-0.9 MEQ/L-% IV SOLN
INTRAVENOUS | Status: DC
  Filled 2017-11-15: qty 1000

## 2017-11-15 NOTE — Progress Notes (Signed)
Physical Therapy Treatment Patient Details Name: Kristin Yoder MRN: [id number] DOB: 11/18/1970 Today's Date: 11/15/2017    History of Present Illness Pt is a 47 yo female who fell in her garage and hit the back of her head and loss consciousness. CT of head revealed hemorrhagic contusions within the lower bifrontal lobes, scalp edema/hematoma overlying the L occipital bone, and thin extra-axial hemorrhage alone adjacent anterior falx, likely subdural.    PT Comments    Pt remains easily irritated, noted decreased safety awareness. Pt functioning at supervision level for safety. Pt appears in pain but denies headache, c/o bilat LE with ambulation. Acute PT to cont to follow.   Follow Up Recommendations  No PT follow up;Supervision/Assistance - 24 hour     Equipment Recommendations  None recommended by PT    Recommendations for Other Services       Precautions / Restrictions Precautions Precautions: Fall Restrictions Weight Bearing Restrictions: No    Mobility  Bed Mobility Overal bed mobility: Modified Independent             General bed mobility comments: no difficulty  Transfers Overall transfer level: Needs assistance Equipment used: None Transfers: Sit to/from Stand Sit to Stand: Supervision         General transfer comment: pt mildly unsteady upon initial stand  Ambulation/Gait Ambulation/Gait assistance: Supervision;Min guard Gait Distance (Feet): 150 Feet Assistive device: None Gait Pattern/deviations: Step-through pattern;Decreased stride length;Narrow base of support Gait velocity: wfl Gait velocity interpretation: 1.31 - 2.62 ft/sec, indicative of limited community ambulator General Gait Details: pt not excited to amb, no AD needed, pt with mild sway requiring min guard for safety   Stairs Stairs: Yes Stairs assistance: Min guard Stair Management: One rail Right Number of Stairs: 12 General stair comments: alternating stepping  pattern   Wheelchair Mobility    Modified Rankin (Stroke Patients Only) Modified Rankin (Stroke Patients Only) Pre-Morbid Rankin Score: No symptoms Modified Rankin: Slight disability     Balance Overall balance assessment: Mild deficits observed, not formally tested                                          Cognition Arousal/Alertness: Awake/alert Behavior During Therapy: Flat affect Overall Cognitive Status: Within Functional Limits for tasks assessed                                 General Comments: pt follows commands but easily irritated      Exercises      General Comments General comments (skin integrity, edema, etc.): gave pt and spouse TBI handout and went over what to look out for and when to seek medical attention      Pertinent Vitals/Pain Pain Assessment: Faces Faces Pain Scale: Hurts little more Pain Location: bilat LEs Pain Descriptors / Indicators: Aching;Grimacing Pain Intervention(s): Monitored during session    Home Living                      Prior Function            PT Goals (current goals can now be found in the care plan section) Acute Rehab PT Goals Patient Stated Goal: home today Progress towards PT goals: Progressing toward goals    Frequency    Min 3X/week      PT Plan Current  plan remains appropriate    Co-evaluation              AM-PAC PT "6 Clicks" Daily Activity  Outcome Measure  Difficulty turning over in bed (including adjusting bedclothes, sheets and blankets)?: None Difficulty moving from lying on back to sitting on the side of the bed? : None Difficulty sitting down on and standing up from a chair with arms (e.g., wheelchair, bedside commode, etc,.)?: A Little Help needed moving to and from a bed to chair (including a wheelchair)?: A Little Help needed walking in hospital room?: A Little Help needed climbing 3-5 steps with a railing? : A Little 6 Click Score: 20     End of Session Equipment Utilized During Treatment: Gait belt Activity Tolerance: Patient tolerated treatment well Patient left: in bed;with call bell/phone within reach;with family/visitor present Nurse Communication: Mobility status PT Visit Diagnosis: Unsteadiness on feet (R26.81)     Time: 1610-9604 PT Time Calculation (min) (ACUTE ONLY): 12 min  Charges:  $Gait Training: 8-22 mins                     Lewis Shock, PT, DPT Acute Rehabilitation Services Pager #: [phone] Office #: [phone]    Kristin Yoder 11/15/2017, 1:39 PM

## 2017-11-15 NOTE — Progress Notes (Signed)
Patient ID: Kristin Yoder, female   DOB: [birth year], 47 y.o.   MRN: [id number] BP (!) 147/89   Pulse (!) 38   Temp (!) 97.3 F (36.3 C) (Oral)   Resp 16   Ht 5\' 2"  (1.575 m)   Wt 63.3 kg   SpO2 100%   BMI 25.52 kg/m  Lethargic, easily aroused with stimulation. Following all commands. Moving all extremities well Head CT showed a larger area of the right basal frontal lobe involved. Given her overall appearance I will not discharge today. Repeat ct tomorrow.

## 2017-11-16 ENCOUNTER — Inpatient Hospital Stay (HOSPITAL_COMMUNITY): Payer: BLUE CROSS/BLUE SHIELD

## 2017-11-16 LAB — SODIUM: Sodium: 138 mmol/L (ref 135–145)

## 2017-11-16 NOTE — Discharge Instructions (Signed)
Head Injury, Adult  There are many types of head injuries. They can be as minor as a bump. Some head injuries can be worse. Worse injuries include:  · A strong hit to the head that hurts the brain (concussion).  · A bruise of the brain (contusion). This means there is bleeding in the brain that can cause swelling.  · A cracked skull (skull fracture).  · Bleeding in the brain that gathers, gets thick (makes a clot), and forms a bump (hematoma).    Most problems from a head injury come in the first 24 hours. However, you may still have side effects up to 7-10 days after your injury. It is important to watch your condition for any changes.  Follow these instructions at home:  Activity  · Rest as much as possible.  · Avoid activities that are hard or tiring.  · Make sure you get enough sleep.  · Limit activities that need a lot of thought or attention, such as:  ? Watching TV.  ? Playing memory games and puzzles.  ? Job-related work or homework.  ? Working on the computer, social media, and texting.  · Avoid activities that could cause another head injury until your doctor says it is okay. This includes playing sports.  · Ask your doctor when it is safe for you to go back to your normal activities, such as work or school. Ask your doctor for a step-by-step plan for slowly going back to your normal activities.  · Ask your doctor when you can drive, ride a bicycle, or use heavy machinery. Never do these activities if you are dizzy.  Lifestyle  · Do not drink alcohol until your doctor says it is okay.  · Avoid drug use.  · If it is harder than usual to remember things, write them down.  · If you are easily distracted, try to do one thing at a time.  · Talk with family members or close friends when making important decisions.  · Tell your friends, family, a trusted coworker, and work manager about your injury, symptoms, and limits (restrictions). Have them watch for any problems that are new or getting worse.  General  instructions  · Take over-the-counter and prescription medicines only as told by your doctor.  · Have someone stay with you for 24 hours after your head injury. This person should watch you for any changes in your symptoms and be ready to get help.  · Keep all follow-up visits as told by your doctor. This is important.  How is this prevented?  · Work on your balance and strength. This can help you avoid falls.  · Wear a seatbelt when you are in a moving vehicle.  · Wear a helmet when:  ? Riding a bicycle.  ? Skiing.  ? Doing any other sport or activity that has a risk of injury.  · Drink alcohol only in moderation.  · Make your home safer by:  ? Getting rid of clutter from the floors and stairs, like things that can make you trip.  ? Using grab bars in bathrooms and handrails by stairs.  ? Placing non-slip mats on floors and in bathtubs.  ? Putting more light in dim areas.  Get help right away if:  · You have:  ? A very bad (severe) headache that is not helped by medicine.  ? Trouble walking or weakness in your arms and legs.  ? Clear or bloody fluid coming from your nose   or ears.  ? Changes in your seeing (vision).  ? Jerky movements that you cannot control (seizure).  · You throw up (vomit).  · Your symptoms get worse.  · You lose balance.  · Your speech is slurred.  · You pass out.  · You are sleepier and have trouble staying awake.  · The black centers of your eyes (pupils) change in size.  These symptoms may be an emergency. Do not wait to see if the symptoms will go away. Get medical help right away. Call your local emergency services (911 in the [place] Do not drive yourself to the hospital.  This information is not intended to replace advice given to you by your health care provider. Make sure you discuss any questions you have with your health care provider.  Document Released: 01/01/2008 Document Revised: 05/14/2016 Document Reviewed: 07/29/2015  Elsevier Interactive Patient Education © 2018 [employer]

## 2017-11-16 NOTE — Progress Notes (Signed)
Pt being discharged home via wheelchair with family. Pt alert and oriented x4. VSS. Pt c/o no pain at this time. No signs of respiratory distress. Education complete and care plans resolved. IV removed with catheter intact and pt tolerated well. No further issues at this time. Pt to follow up with PCP. Sukaina Toothaker R, RN 

## 2017-11-16 NOTE — Discharge Summary (Signed)
Physician Discharge Summary  Patient ID: Kristin Yoder MRN: [id number] DOB/AGE: [birth year] 47 y.o.  Admit date: 11/12/2017 Discharge date: 11/16/2017  Admission Diagnoses:bifrontal cerebral contusions status post fall  Discharge Diagnoses:  Active Problems:   Intracranial hemorrhage following injury with loss of consciousness, initial encounter [facility])   Discharged Condition: good  Hospital Course: Kristin Yoder is a 47 y.o. female Presented to the hospital with cerebral contusions after losing consciousness and falling. The contusions matured, and she has been sleepy. She is appropriate and has a normal neurological exam. CT now stable over the last two days.  Treatments: observation  Discharge Exam: Blood pressure (!) 148/94, pulse (!) 57, temperature 98 F (36.7 C), temperature source Oral, resp. rate 12, height 5\' 2"  (1.575 m), weight 63.3 kg, SpO2 98 %. Neurologic: Alert and oriented X 3, normal strength and tone. Normal symmetric reflexes. Normal coordination and gait  Disposition: Discharge disposition: 01-Home or Self Care      * No surgery found *  Allergies as of 11/16/2017   No Known Allergies     Medication List    You have not been prescribed any medications.      Signed: Amaree Yoder L 11/16/2017, 3:13 PM

## 2017-12-05 DIAGNOSIS — R030 Elevated blood-pressure reading, without diagnosis of hypertension: Secondary | ICD-10-CM | POA: Diagnosis not present

## 2017-12-05 DIAGNOSIS — Z6825 Body mass index (BMI) 25.0-25.9, adult: Secondary | ICD-10-CM | POA: Diagnosis not present

## 2017-12-05 DIAGNOSIS — S0990XA Unspecified injury of head, initial encounter: Secondary | ICD-10-CM | POA: Diagnosis not present

## 2018-09-06 ENCOUNTER — Other Ambulatory Visit: Payer: Self-pay

## 2018-09-06 ENCOUNTER — Ambulatory Visit (INDEPENDENT_AMBULATORY_CARE_PROVIDER_SITE_OTHER): Payer: BLUE CROSS/BLUE SHIELD | Admitting: Family Medicine

## 2018-09-06 ENCOUNTER — Encounter: Payer: Self-pay | Admitting: Family Medicine

## 2018-09-06 VITALS — BP 154/96 | HR 57 | Temp 97.6°F | Ht 62.0 in | Wt 144.0 lb

## 2018-09-06 DIAGNOSIS — Z0001 Encounter for general adult medical examination with abnormal findings: Secondary | ICD-10-CM | POA: Diagnosis not present

## 2018-09-06 DIAGNOSIS — Z13220 Encounter for screening for lipoid disorders: Secondary | ICD-10-CM

## 2018-09-06 DIAGNOSIS — E663 Overweight: Secondary | ICD-10-CM

## 2018-09-06 DIAGNOSIS — R011 Cardiac murmur, unspecified: Secondary | ICD-10-CM | POA: Diagnosis not present

## 2018-09-06 DIAGNOSIS — R030 Elevated blood-pressure reading, without diagnosis of hypertension: Secondary | ICD-10-CM

## 2018-09-06 DIAGNOSIS — Z124 Encounter for screening for malignant neoplasm of cervix: Secondary | ICD-10-CM

## 2018-09-06 DIAGNOSIS — Z6826 Body mass index (BMI) 26.0-26.9, adult: Secondary | ICD-10-CM

## 2018-09-06 LAB — CBC
HCT: 40.4 % (ref 36.0–46.0)
Hemoglobin: 13.4 g/dL (ref 12.0–15.0)
MCHC: 33.1 g/dL (ref 30.0–36.0)
MCV: 87.4 fl (ref 78.0–100.0)
Platelets: 139 10*3/uL — ABNORMAL LOW (ref 150.0–400.0)
RBC: 4.63 Mil/uL (ref 3.87–5.11)
RDW: 15.3 % (ref 11.5–15.5)
WBC: 4.5 10*3/uL (ref 4.0–10.5)

## 2018-09-06 LAB — COMPREHENSIVE METABOLIC PANEL
ALT: 11 U/L (ref 0–35)
AST: 13 U/L (ref 0–37)
Albumin: 4.4 g/dL (ref 3.5–5.2)
Alkaline Phosphatase: 40 U/L (ref 39–117)
BUN: 18 mg/dL (ref 6–23)
CO2: 27 mEq/L (ref 19–32)
Calcium: 9.2 mg/dL (ref 8.4–10.5)
Chloride: 104 mEq/L (ref 96–112)
Creatinine, Ser: 0.88 mg/dL (ref 0.40–1.20)
GFR: 82.36 mL/min (ref >60.00)
Glucose, Bld: 81 mg/dL (ref 70–99)
Potassium: 4.3 mEq/L (ref 3.5–5.1)
Sodium: 137 mEq/L (ref 135–145)
Total Bilirubin: 0.5 mg/dL (ref 0.2–1.2)
Total Protein: 7.3 g/dL (ref 6.0–8.3)

## 2018-09-06 LAB — LIPID PANEL
Cholesterol: 172 mg/dL (ref 0–200)
HDL: 63.7 mg/dL (ref >39.00)
LDL Cholesterol: 102 mg/dL — ABNORMAL HIGH (ref 0–99)
NonHDL: 108.25
Total CHOL/HDL Ratio: 3
Triglycerides: 32 mg/dL (ref 0.0–149.0)
VLDL: 6.4 mg/dL (ref 0.0–40.0)

## 2018-09-06 LAB — TSH: TSH: 1.95 u[IU]/mL (ref 0.35–4.50)

## 2018-09-06 NOTE — Progress Notes (Signed)
Chief Complaint:  Kristin Yoder is a 48 y.o. female who presents today for her annual comprehensive physical exam and to establish care.   Assessment/Plan:  Elevated blood pressure reading Patient with no prior elevated blood pressure readings.  She has not been checking at home however does have a home blood pressure cuff.  Only have any symptoms.  No chest pain or shortness of breath.  No weakness.  Recommended she continue with home monitoring over the next couple of weeks and let me know if persistently 140/90 or lower.  Discussed lifestyle modifications including regular exercise and low-salt diet.  Discussed reasons to return to care.  Check CBC, CMET, and TSH.   Systolic murmur Asymptomatic.  Continue with watchful waiting.  Discussed reasons to return to care.   Body mass index is 26.34 kg/m. / Overweight BMI Metric Follow Up - 09/06/18 1010      BMI Metric Follow Up-Please document annually   BMI Metric Follow Up  Education provided        Preventative Healthcare: Check CBC, C met, TSH, and lipid panel.  Place referral to GYN for Pap smear per patient request.   Patient Counseling(The following topics were reviewed and/or handout was given):  -Nutrition: Stressed importance of moderation in sodium/caffeine intake, saturated fat and cholesterol, caloric balance, sufficient intake of fresh fruits, vegetables, and fiber.  -Stressed the importance of regular exercise.   -Substance Abuse: Discussed cessation/primary prevention of tobacco, alcohol, or other drug use; driving or other dangerous activities under the influence; availability of treatment for abuse.   -Injury prevention: Discussed safety belts, safety helmets, smoke detector, smoking near bedding or upholstery.   -Sexuality: Discussed sexually transmitted diseases, partner selection, use of condoms, avoidance of unintended pregnancy and contraceptive alternatives.   -Dental health: Discussed importance of regular tooth  brushing, flossing, and dental visits.  -Health maintenance and immunizations reviewed. Please refer to Health maintenance section.  Return to care in 1 year for next preventative visit.     Subjective:  HPI:  She has no acute complaints today.   Lifestyle Diet: No specific diets.  Exercise: Likes to walk and run. Likes exercise bike.   Depression screen PHQ 2/9 09/06/2018  Decreased Interest 0  Down, Depressed, Hopeless 0  PHQ - 2 Score 0   Health Maintenance Due  Topic Date Due  . TETANUS/TDAP  03/11/2018  . INFLUENZA VACCINE  09/02/2018     ROS: Per HPI, otherwise a complete review of systems was negative.   PMH:  The following were reviewed and entered/updated in epic: History reviewed. No pertinent past medical history. Patient Active Problem List   Diagnosis Date Noted  . Systolic murmur 95/28/4132  . Elevated blood pressure reading 09/06/2018   Past Surgical History:  Procedure Laterality Date  . CESAREAN SECTION      Family History  Problem Relation Age of Onset  . Cancer Neg Hx     Medications- reviewed and updated No current outpatient medications on file.   No current facility-administered medications for this visit.     Allergies-reviewed and updated No Known Allergies  Social History   Socioeconomic History  . Marital status: Married    Spouse name: Not on file  . Number of children: Not on file  . Years of education: Not on file  . Highest education level: Not on file  Occupational History  . Not on file  Social Needs  . Financial resource strain: Not on file  . Food insecurity  Worry: Not on file    Inability: Not on file  . Transportation needs    Medical: Not on file    Non-medical: Not on file  Tobacco Use  . Smoking status: Never Smoker  . Smokeless tobacco: Never Used  Substance and Sexual Activity  . Alcohol use: Never    Frequency: Never  . Drug use: Never  . Sexual activity: Yes  Lifestyle  . Physical activity     Days per week: Not on file    Minutes per session: Not on file  . Stress: Not on file  Relationships  . Social Herbalist on phone: Not on file    Gets together: Not on file    Attends religious service: Not on file    Active member of club or organization: Not on file    Attends meetings of clubs or organizations: Not on file    Relationship status: Not on file  Other Topics Concern  . Not on file  Social History Narrative  . Not on file        Objective:  Physical Exam: BP (!) 154/96 (BP Location: Left Arm, Patient Position: Sitting, Cuff Size: Normal)   Pulse (!) 57   Temp 97.6 F (36.4 C) (Oral)   Ht '5\' 2"'$  (1.575 m)   Wt 144 lb (65.3 kg)   LMP 07/30/2018   SpO2 99%   BMI 26.34 kg/m   Body mass index is 26.34 kg/m. Wt Readings from Last 3 Encounters:  09/06/18 144 lb (65.3 kg)  11/13/17 139 lb 8.8 oz (63.3 kg)   Gen: NAD, resting comfortably HEENT: TMs normal bilaterally. OP clear. No thyromegaly noted.  CV: RRR with 2/6 systolic murmur appreciated Pulm: NWOB, CTAB with no crackles, wheezes, or rhonchi GI: Normal bowel sounds present. Soft, Nontender, Nondistended. MSK: no edema, cyanosis, or clubbing noted Skin: warm, dry Neuro: CN2-12 grossly intact. Strength 5/5 in upper and lower extremities. Reflexes symmetric and intact bilaterally.  Psych: Normal affect and thought content     Brinae Woods M. Jerline Pain, MD 09/06/2018 10:11 AM

## 2018-09-06 NOTE — Patient Instructions (Signed)
It was very nice to see you today!  Please keep an eye on your blood pressure and let me know if persistently 140/90 over the next few weeks.  We will check blood work today.  I will place a referral for you to see a gynecologist.  Come back to me in 1 year for your next check up, or sooner if needed.   Take care, Dr Jerline Pain  Please try these tips to maintain a healthy lifestyle:   Eat at least 3 REAL meals and 1-2 snacks per day.  Aim for no more than 5 hours between eating.  If you eat breakfast, please do so within one hour of getting up.    Obtain twice as many fruits/vegetables as protein or carbohydrate foods for both lunch and dinner. (Half of each meal should be fruits/vegetables, one quarter protein, and one quarter starchy carbs)   Cut down on sweet beverages. This includes juice, soda, and sweet tea.    Exercise at least 150 minutes every week.    Preventive Care 4-30 Years Old, Female Preventive care refers to visits with your health care provider and lifestyle choices that can promote health and wellness. This includes:  A yearly physical exam. This may also be called an annual well check.  Regular dental visits and eye exams.  Immunizations.  Screening for certain conditions.  Healthy lifestyle choices, such as eating a healthy diet, getting regular exercise, not using drugs or products that contain nicotine and tobacco, and limiting alcohol use. What can I expect for my preventive care visit? Physical exam Your health care provider will check your:  Height and weight. This may be used to calculate body mass index (BMI), which tells if you are at a healthy weight.  Heart rate and blood pressure.  Skin for abnormal spots. Counseling Your health care provider may ask you questions about your:  Alcohol, tobacco, and drug use.  Emotional well-being.  Home and relationship well-being.  Sexual activity.  Eating habits.  Work and work Statistician.   Method of birth control.  Menstrual cycle.  Pregnancy history. What immunizations do I need?  Influenza (flu) vaccine  This is recommended every year. Tetanus, diphtheria, and pertussis (Tdap) vaccine  You may need a Td booster every 10 years. Varicella (chickenpox) vaccine  You may need this if you have not been vaccinated. Zoster (shingles) vaccine  You may need this after age 23. Measles, mumps, and rubella (MMR) vaccine  You may need at least one dose of MMR if you were born in 1957 or later. You may also need a second dose. Pneumococcal conjugate (PCV13) vaccine  You may need this if you have certain conditions and were not previously vaccinated. Pneumococcal polysaccharide (PPSV23) vaccine  You may need one or two doses if you smoke cigarettes or if you have certain conditions. Meningococcal conjugate (MenACWY) vaccine  You may need this if you have certain conditions. Hepatitis A vaccine  You may need this if you have certain conditions or if you travel or work in places where you may be exposed to hepatitis A. Hepatitis B vaccine  You may need this if you have certain conditions or if you travel or work in places where you may be exposed to hepatitis B. Haemophilus influenzae type b (Hib) vaccine  You may need this if you have certain conditions. Human papillomavirus (HPV) vaccine  If recommended by your health care provider, you may need three doses over 6 months. You may receive vaccines  as individual doses or as more than one vaccine together in one shot (combination vaccines). Talk with your health care provider about the risks and benefits of combination vaccines. What tests do I need? Blood tests  Lipid and cholesterol levels. These may be checked every 5 years, or more frequently if you are over 85 years old.  Hepatitis C test.  Hepatitis B test. Screening  Lung cancer screening. You may have this screening every year starting at age 29 if you  have a 30-pack-year history of smoking and currently smoke or have quit within the past 15 years.  Colorectal cancer screening. All adults should have this screening starting at age 78 and continuing until age 27. Your health care provider may recommend screening at age 57 if you are at increased risk. You will have tests every 1-10 years, depending on your results and the type of screening test.  Diabetes screening. This is done by checking your blood sugar (glucose) after you have not eaten for a while (fasting). You may have this done every 1-3 years.  Mammogram. This may be done every 1-2 years. Talk with your health care provider about when you should start having regular mammograms. This may depend on whether you have a family history of breast cancer.  BRCA-related cancer screening. This may be done if you have a family history of breast, ovarian, tubal, or peritoneal cancers.  Pelvic exam and Pap test. This may be done every 3 years starting at age 11. Starting at age 84, this may be done every 5 years if you have a Pap test in combination with an HPV test. Other tests  Sexually transmitted disease (STD) testing.  Bone density scan. This is done to screen for osteoporosis. You may have this scan if you are at high risk for osteoporosis. Follow these instructions at home: Eating and drinking  Eat a diet that includes fresh fruits and vegetables, whole grains, lean protein, and low-fat dairy.  Take vitamin and mineral supplements as recommended by your health care provider.  Do not drink alcohol if: ? Your health care provider tells you not to drink. ? You are pregnant, may be pregnant, or are planning to become pregnant.  If you drink alcohol: ? Limit how much you have to 0-1 drink a day. ? Be aware of how much alcohol is in your drink. In the [place] one drink equals one 12 oz bottle of beer (355 mL), one 5 oz glass of wine (148 mL), or one 1 oz glass of hard liquor (44 mL).  Lifestyle  Take daily care of your teeth and gums.  Stay active. Exercise for at least 30 minutes on 5 or more days each week.  Do not use any products that contain nicotine or tobacco, such as cigarettes, e-cigarettes, and chewing tobacco. If you need help quitting, ask your health care provider.  If you are sexually active, practice safe sex. Use a condom or other form of birth control (contraception) in order to prevent pregnancy and STIs (sexually transmitted infections).  If told by your health care provider, take low-dose aspirin daily starting at age 70. What's next?  Visit your health care provider once a year for a well check visit.  Ask your health care provider how often you should have your eyes and teeth checked.  Stay up to date on all vaccines. This information is not intended to replace advice given to you by your health care provider. Make sure you discuss any questions  you have with your health care provider. Document Released: 02/14/2015 Document Revised: 09/29/2017 Document Reviewed: 09/29/2017 Elsevier Patient Education  2020 [employer].

## 2018-09-06 NOTE — Assessment & Plan Note (Signed)
Asymptomatic.  Continue with watchful waiting.  Discussed reasons to return to care.

## 2018-09-06 NOTE — Assessment & Plan Note (Addendum)
Patient with no prior elevated blood pressure readings.  She has not been checking at home however does have a home blood pressure cuff.  Only have any symptoms.  No chest pain or shortness of breath.  No weakness.  Recommended she continue with home monitoring over the next couple of weeks and let me know if persistently 140/90 or lower.  Discussed lifestyle modifications including regular exercise and low-salt diet.  Discussed reasons to return to care.  Check CBC, CMET, and TSH.

## 2018-09-07 ENCOUNTER — Encounter: Payer: Self-pay | Admitting: Family Medicine

## 2018-09-07 DIAGNOSIS — E785 Hyperlipidemia, unspecified: Secondary | ICD-10-CM | POA: Insufficient documentation

## 2018-09-07 NOTE — Progress Notes (Signed)
Please inform patient of the following:  Her "bad" cholesterol is just a little high but everything else looks great. Do not need to start any medications at this time. She should continue working on diet and exercise and we can recheck again in a few years.  Algis Greenhouse. Jerline Pain, MD 09/07/2018 7:44 AM

## 2018-09-13 ENCOUNTER — Encounter: Payer: Self-pay | Admitting: Certified Nurse Midwife

## 2018-09-27 ENCOUNTER — Other Ambulatory Visit: Payer: Self-pay

## 2018-09-29 ENCOUNTER — Other Ambulatory Visit: Payer: Self-pay

## 2018-09-29 ENCOUNTER — Other Ambulatory Visit (HOSPITAL_COMMUNITY)
Admission: RE | Admit: 2018-09-29 | Discharge: 2018-09-29 | Disposition: A | Discharge status: Home / Self Care | Payer: BC Managed Care – PPO | Source: Ambulatory Visit | Attending: Certified Nurse Midwife | Admitting: Certified Nurse Midwife

## 2018-09-29 ENCOUNTER — Encounter: Payer: Self-pay | Admitting: Certified Nurse Midwife

## 2018-09-29 ENCOUNTER — Telehealth: Payer: Self-pay | Admitting: *Deleted

## 2018-09-29 ENCOUNTER — Ambulatory Visit (INDEPENDENT_AMBULATORY_CARE_PROVIDER_SITE_OTHER): Payer: BC Managed Care – PPO | Admitting: Certified Nurse Midwife

## 2018-09-29 VITALS — BP 120/82 | HR 68 | Temp 97.3°F | Resp 16 | Ht 62.75 in | Wt 142.0 lb

## 2018-09-29 DIAGNOSIS — Z01411 Encounter for gynecological examination (general) (routine) with abnormal findings: Secondary | ICD-10-CM | POA: Diagnosis not present

## 2018-09-29 DIAGNOSIS — Z23 Encounter for immunization: Secondary | ICD-10-CM

## 2018-09-29 DIAGNOSIS — N852 Hypertrophy of uterus: Secondary | ICD-10-CM

## 2018-09-29 DIAGNOSIS — Z124 Encounter for screening for malignant neoplasm of cervix: Secondary | ICD-10-CM

## 2018-09-29 NOTE — Patient Instructions (Signed)
EXERCISE AND DIET:  We recommended that you start or continue a regular exercise program for good health. Regular exercise means any activity that makes your heart beat faster and makes you sweat.  We recommend exercising at least 30 minutes per day at least 3 days a week, preferably 4 or 5.  We also recommend a diet low in fat and sugar.  Inactivity, poor dietary choices and obesity can cause diabetes, heart attack, stroke, and kidney damage, among others.    ALCOHOL AND SMOKING:  Women should limit their alcohol intake to no more than 7 drinks/beers/glasses of wine (combined, not each!) per week. Moderation of alcohol intake to this level decreases your risk of breast cancer and liver damage. And of course, no recreational drugs are part of a healthy lifestyle.  And absolutely no smoking or even second hand smoke. Most people know smoking can cause heart and lung diseases, but did you know it also contributes to weakening of your bones? Aging of your skin?  Yellowing of your teeth and nails?  CALCIUM AND VITAMIN D:  Adequate intake of calcium and Vitamin D are recommended.  The recommendations for exact amounts of these supplements seem to change often, but generally speaking 600 mg of calcium (either carbonate or citrate) and 800 units of Vitamin D per day seems prudent. Certain women may benefit from higher intake of Vitamin D.  If you are among these women, your doctor will have told you during your visit.    PAP SMEARS:  Pap smears, to check for cervical cancer or precancers,  have traditionally been done yearly, although recent scientific advances have shown that most women can have pap smears less often.  However, every woman still should have a physical exam from her gynecologist every year. It will include a breast check, inspection of the vulva and vagina to check for abnormal growths or skin changes, a visual exam of the cervix, and then an exam to evaluate the size and shape of the uterus and  ovaries.  And after 50 years of age, a rectal exam is indicated to check for rectal cancers. We will also provide age appropriate advice regarding health maintenance, like when you should have certain vaccines, screening for sexually transmitted diseases, bone density testing, colonoscopy, mammograms, etc.   MAMMOGRAMS:  All women over 40 years old should have a yearly mammogram. Many facilities now offer a "3D" mammogram, which may cost around $50 extra out of pocket. If possible,  we recommend you accept the option to have the 3D mammogram performed.  It both reduces the number of women who will be called back for extra views which then turn out to be normal, and it is better than the routine mammogram at detecting truly abnormal areas.    COLONOSCOPY:  Colonoscopy to screen for colon cancer is recommended for all women at age 50.  We know, you hate the idea of the prep.  We agree, BUT, having colon cancer and not knowing it is worse!!  Colon cancer so often starts as a polyp that can be seen and removed at colonscopy, which can quite literally save your life!  And if your first colonoscopy is normal and you have no family history of colon cancer, most women don't have to have it again for 10 years.  Once every ten years, you can do something that may end up saving your life, right?  We will be happy to help you get it scheduled when you are ready.    Be sure to check your insurance coverage so you understand how much it will cost.  It may be covered as a preventative service at no cost, but you should check your particular policy.      Uterine Fibroids  Uterine fibroids (leiomyomas) are noncancerous (benign) tumors that can develop in the uterus. Fibroids may also develop in the fallopian tubes, cervix, or tissues (ligaments) near the uterus. You may have one or many fibroids. Fibroids vary in size, weight, and where they grow in the uterus. Some can become quite large. Most fibroids do not require  medical treatment. What are the causes? The cause of this condition is not known. What increases the risk? You are more likely to develop this condition if you:  Are in your 30s or 40s and have not gone through menopause.  Have a family history of this condition.  Are of African-American descent.  Had your first period at an early age (early menarche).  Have not had any children (nulliparity).  Are overweight or obese. What are the signs or symptoms? Many women do not have any symptoms. Symptoms of this condition may include:  Heavy menstrual bleeding.  Bleeding or spotting between periods.  Pain and pressure in the pelvic area, between the hips.  Bladder problems, such as needing to urinate urgently or more often than usual.  Inability to have children (infertility).  Failure to carry pregnancy to term (miscarriage). How is this diagnosed? This condition may be diagnosed based on:  Your symptoms and medical history.  A physical exam.  A pelvic exam that includes feeling for any tumors.  Imaging tests, such as ultrasound or MRI. How is this treated? Treatment for this condition may include:  Seeing your health care provider for follow-up visits to monitor your fibroids for any changes.  Taking NSAIDs such as ibuprofen, naproxen, or aspirin to reduce pain.  Hormone medicines. These may be taken as a pill, given in an injection, or delivered by a T-shaped device that is inserted into the uterus (intrauterine device, IUD).  Surgery to remove one of the following: ? The fibroids (myomectomy). Your health care provider may recommend this if fibroids affect your fertility and you want to become pregnant. ? The uterus (hysterectomy). ? Blood supply to the fibroids (uterine artery embolization). Follow these instructions at home:  Take over-the-counter and prescription medicines only as told by your health care provider.  Ask your health care provider if you should  take iron pills or eat more iron-rich foods, such as dark green, leafy vegetables. Heavy menstrual bleeding can cause low iron levels.  If directed, apply heat to your back or abdomen to reduce pain. Use the heat source that your health care provider recommends, such as a moist heat pack or a heating pad. ? Place a towel between your skin and the heat source. ? Leave the heat on for 20-30 minutes. ? Remove the heat if your skin turns bright red. This is especially important if you are unable to feel pain, heat, or cold. You may have a greater risk of getting burned.  Pay close attention to your menstrual cycle. Tell your health care provider about any changes, such as: ? Increased blood flow that requires you to use more pads or tampons than usual. ? A change in the number of days that your period lasts. ? A change in symptoms that are associated with your period, such as back pain or cramps in your abdomen.  Keep all follow-up visits   as told by your health care provider. This is important, especially if your fibroids need to be monitored for any changes. Contact a health care provider if you:  Have pelvic pain, back pain, or cramps in your abdomen that do not get better with medicine or heat.  Develop new bleeding between periods.  Have increased bleeding during or between periods.  Feel unusually tired or weak.  Feel light-headed. Get help right away if you:  Faint.  Have pelvic pain that suddenly gets worse.  Have severe vaginal bleeding that soaks a tampon or pad in 30 minutes or less. Summary  Uterine fibroids are noncancerous (benign) tumors that can develop in the uterus.  The exact cause of this condition is not known.  Most fibroids do not require medical treatment unless they affect your ability to have children (fertility).  Contact a health care provider if you have pelvic pain, back pain, or cramps in your abdomen that do not get better with medicines.  Make sure  you know what symptoms should cause you to get help right away. This information is not intended to replace advice given to you by your health care provider. Make sure you discuss any questions you have with your health care provider. Document Released: 01/16/2000 Document Revised: 12/31/2016 Document Reviewed: 12/14/2016 Elsevier Patient Education  2020 [employer] Inc.  

## 2018-09-29 NOTE — Telephone Encounter (Signed)
Left message with son, "Tope", to call Sharee Pimple, RN at [facility], [phone]

## 2018-09-29 NOTE — Addendum Note (Signed)
Addended by: Regina Eck on: 09/29/2018 03:32 PM   Modules accepted: Orders

## 2018-09-29 NOTE — Telephone Encounter (Signed)
-----   Message from Regina Eck, CNM sent at 09/29/2018  1:12 PM EDT ----- Enlarged uterus needs PUS scheduled and needs to have TDAP at that visit. Wanted to wait.  She is aware she will be called with details regarding PUS.  Speak slowly for understanding.

## 2018-09-29 NOTE — Telephone Encounter (Signed)
Spoke with patient. Patient declines to schedule PUS at this time. Patient states she wants to wait for pap results before proceeding with PUS. Advised patient the PUS was to further evaluate enlarged uterus. Patient states she understands reason for PUS, wants to wait. Advised I will update Melvia Heaps, CNM and return call if any additional recommendations. Otherwise, will be called with pap results once available. Patient verbalizes understanding.   Routing to Cisco, CNM

## 2018-09-29 NOTE — Progress Notes (Signed)
48 y.o. [id number] Married  African American([place]) Fe here for annual exam. Periods normal duration 5-7 days every month and misses one to two a year at times. Light to heavy no clotting  no medication use for cramping. Contraception spouse uses condoms. Patient has 10416, 48 year old. Denies perimenopausal symptoms. Sees PCP for aex, labs, no medications. Eats well varied diet. No health concerns today. Has not had mammogram for the year.   Patient's last menstrual period was 09/19/2018 (exact date).  Age at onset 89     Sexually active: Yes.    The current method of family planning is none.    Exercising: Yes.    running & biking Smoker:  no  Review of Systems  Constitutional: Negative.   HENT: Negative.   Eyes: Negative.   Respiratory: Negative.   Cardiovascular: Negative.   Gastrointestinal: Negative.   Genitourinary: Negative.   Musculoskeletal: Negative.   Skin: Negative.   Neurological: Negative.   Endo/Heme/Allergies: Negative.   Psychiatric/Behavioral: Negative.     Health Maintenance: Pap:  2 yrs ago per patient History of Abnormal Pap: no MMG:  4020yrs ago neg per patient Self Breast exams: no Colonoscopy:  none BMD:   none TDaP: pt to check immunization records at home, maybe had update Shingles: none Pneumonia: none Hep C and HIV: HIV neg 2019 Labs: with PCP    reports that she has never smoked. She has never used smokeless tobacco. She reports previous alcohol use. She reports that she does not use drugs.  No past medical history on file.  Past Surgical History:  Procedure Laterality Date  . CESAREAN SECTION      No current outpatient medications on file.   No current facility-administered medications for this visit.     Family History  Problem Relation Age of Onset  . Prostate cancer Father   . Hypertension Father     ROS:  Pertinent items are noted in HPI.  Otherwise, a comprehensive ROS was negative.  Exam:   BP 120/82   Pulse 68   Temp (!) 97.3  F (36.3 C) (Skin)   Resp 16   Ht 5' 2.75" (1.594 m)   Wt 142 lb (64.4 kg)   LMP 09/19/2018 (Exact Date)   BMI 25.36 kg/m  Height: 5' 2.75" (159.4 cm) Ht Readings from Last 3 Encounters:  09/29/18 5' 2.75" (1.594 m)  09/06/18 5\' 2"  (1.575 m)  11/13/17 5\' 2"  (1.575 m)    General appearance: alert, cooperative and appears stated age Head: Normocephalic, without obvious abnormality, atraumatic Neck: no adenopathy, supple, symmetrical, trachea midline and thyroid normal to inspection and palpation Lungs: clear to auscultation bilaterally Breasts: normal appearance, no masses or tenderness, No nipple retraction or dimpling, No nipple discharge or bleeding, No axillary or supraclavicular adenopathy, Taught monthly breast self examination Heart: regular rate and rhythm Abdomen: soft, non-tender; no masses,  no organomegaly Extremities: extremities normal, atraumatic, no cyanosis or edema Skin: Skin color, texture, turgor normal. No rashes or lesions Lymph nodes: Cervical, supraclavicular, and axillary nodes normal. No abnormal inguinal nodes palpated Neurologic: Grossly normal   Pelvic: External genitalia:  no lesions              Urethra:  normal appearing urethra with no masses, tenderness or lesions              Bartholin's and Skene's: normal                 Vagina: normal appearing vagina with  normal color and discharge, no lesions              Cervix: no cervical motion tenderness, no lesions and very tight ?stenotic cervix, endocervical cells attempted x 3              Pap taken: Yes.   Bimanual Exam:  Uterus:  Enlarged 8-10 week size, firm tilts to left              Adnexa: normal adnexa, no mass, fullness, tenderness and unable to palpate left, due to uterus               Rectovaginal: Confirms               Anus:  normal sphincter tone, no lesions  Chaperone present: yes  A:  Well Woman with normal exam  Contraception condoms  Enlarged uterus   Mammogram  due  Colonoscopy candidate  Immunization due TDAP  P:   Reviewed health and wellness pertinent to exam  Aware of consistent use for protection  Discussed finding and shown with picture enlargement and possible etiology of fibroids, mass, adenomyosis. Discussed PUS evaluation and would be done here. Questions addressed. She will be called with appointment and insurance information.  Discussed colonoscopy, risks/benefits/ given literature to read, will advise if she desires to have done or scheduled.  Will do at [place] appointment.  Pap smear: yes   counseled on breast self exam, feminine hygiene, adequate intake of calcium and vitamin D, diet and exercise return annually or prn  An After Visit Summary was printed and given to the patient.

## 2018-10-03 LAB — CYTOLOGY - PAP
Adequacy: ABSENT
Diagnosis: NEGATIVE
HPV: NOT DETECTED

## 2018-10-03 NOTE — Telephone Encounter (Signed)
Pap results in forwarded to you

## 2018-10-04 MED ORDER — METRONIDAZOLE 0.75 % VA GEL: 1.0000 | Freq: Every day | VAGINAL | 0 refills | Status: AC

## 2018-10-04 NOTE — Telephone Encounter (Signed)
Notes recorded by Burnice Logan, RN on 10/04/2018 at 9:56 AM EDT  02 recall placed.  ------   Notes recorded by Burnice Logan, RN on 10/04/2018 at 9:55 AM EDT  Spoke with patient, advised as seen below per Melvia Heaps, CNM. Rx for metrogel to verified pharmacy. ETOH precautions reviewed. PUS scheudled for 9/10 at 11am, consult to follow at 11:30am with Dr. Quincy Simmonds. Order placed for precert. Patient verbalizes understanding and is agreeable.   See telephone encounter dated 09/29/18.   Encounter closed.     Routing to Cisco, CNM.  Cc: Dr. Quincy Simmonds, Magdalene Patricia

## 2018-10-06 ENCOUNTER — Telehealth: Payer: Self-pay | Admitting: Obstetrics and Gynecology

## 2018-10-06 NOTE — Telephone Encounter (Signed)
Call placed to convey benefits for ultrasound. Spoke with patient and conveyed benefits. Patient understands/agreeable with the benefits. Appointment scheduled 10/12/18. Patient is aware of the cancellation policy.

## 2018-10-11 ENCOUNTER — Telehealth: Payer: Self-pay | Admitting: Obstetrics and Gynecology

## 2018-10-11 NOTE — Telephone Encounter (Signed)
Call placed to rescheduled ultrasound cancelled by patient. Left voicemail message requesting a return call

## 2018-10-12 ENCOUNTER — Other Ambulatory Visit: Payer: BC Managed Care – PPO | Admitting: Obstetrics and Gynecology

## 2018-10-12 ENCOUNTER — Other Ambulatory Visit: Payer: Self-pay

## 2018-10-19 NOTE — Telephone Encounter (Signed)
I recommend sending a letter to patient recommending pelvic ultrasound for further evaluation following her pelvic exam.  She has suspected uterine fibroids, but this can only be confirmed after she has an ultrasound performed.   For her convenience, she may have this done at our office or at an imaging center in [place].   Cc- Lerry Liner

## 2018-10-19 NOTE — Telephone Encounter (Signed)
Second call placed to follow up and reschedule recommended ultrasound. Left voicemail on mobile number, requesting a return call.   cc: Dr Quincy Simmonds

## 2018-10-23 NOTE — Telephone Encounter (Signed)
Standard PUS Letter sent via [place] mail to address on file.   Routing to provider for final review. Patient is agreeable to disposition. Will close encounter.

## 2018-11-22 ENCOUNTER — Ambulatory Visit (INDEPENDENT_AMBULATORY_CARE_PROVIDER_SITE_OTHER): Payer: BC Managed Care – PPO

## 2018-11-22 ENCOUNTER — Other Ambulatory Visit: Payer: Self-pay

## 2018-11-22 DIAGNOSIS — Z23 Encounter for immunization: Secondary | ICD-10-CM | POA: Diagnosis not present

## 2019-04-04 ENCOUNTER — Telehealth: Payer: Self-pay

## 2019-04-04 NOTE — Telephone Encounter (Signed)
Noted  

## 2019-04-04 NOTE — Telephone Encounter (Signed)
Patient states that she didn't receive the flu shot and do not need the flu shot because winter is almost over

## 2019-04-23 ENCOUNTER — Encounter: Payer: Self-pay | Admitting: Certified Nurse Midwife

## 2019-07-06 ENCOUNTER — Encounter: Payer: Self-pay | Admitting: Family Medicine

## 2019-07-06 NOTE — Telephone Encounter (Signed)
TRIED CALLING THE PATIENT PHONE KEPT RINGING TO SEE WHO SHE WOULD LIKE TO TOC

## 2019-07-09 ENCOUNTER — Telehealth: Payer: Self-pay | Admitting: Family Medicine

## 2019-07-09 ENCOUNTER — Other Ambulatory Visit: Payer: Self-pay

## 2019-07-09 DIAGNOSIS — Z124 Encounter for screening for malignant neoplasm of cervix: Secondary | ICD-10-CM

## 2019-07-09 NOTE — Telephone Encounter (Signed)
Referral Placed 

## 2019-07-09 NOTE — Telephone Encounter (Signed)
Patient is calling in asking if Dr.Parker could give her a referral to a GYN doctor

## 2019-08-15 DIAGNOSIS — L293 Anogenital pruritus, unspecified: Secondary | ICD-10-CM | POA: Diagnosis not present

## 2019-08-15 DIAGNOSIS — D259 Leiomyoma of uterus, unspecified: Secondary | ICD-10-CM | POA: Diagnosis not present

## 2019-08-15 DIAGNOSIS — Z131 Encounter for screening for diabetes mellitus: Secondary | ICD-10-CM | POA: Diagnosis not present

## 2019-08-15 DIAGNOSIS — Z1231 Encounter for screening mammogram for malignant neoplasm of breast: Secondary | ICD-10-CM | POA: Diagnosis not present

## 2019-08-15 DIAGNOSIS — N760 Acute vaginitis: Secondary | ICD-10-CM | POA: Diagnosis not present

## 2019-08-15 DIAGNOSIS — Z6827 Body mass index (BMI) 27.0-27.9, adult: Secondary | ICD-10-CM | POA: Diagnosis not present

## 2019-08-15 DIAGNOSIS — Z13220 Encounter for screening for lipoid disorders: Secondary | ICD-10-CM | POA: Diagnosis not present

## 2019-08-15 DIAGNOSIS — Z01419 Encounter for gynecological examination (general) (routine) without abnormal findings: Secondary | ICD-10-CM | POA: Diagnosis not present

## 2019-08-15 DIAGNOSIS — Z1329 Encounter for screening for other suspected endocrine disorder: Secondary | ICD-10-CM | POA: Diagnosis not present

## 2019-08-15 DIAGNOSIS — Z1321 Encounter for screening for nutritional disorder: Secondary | ICD-10-CM | POA: Diagnosis not present

## 2019-08-15 LAB — BASIC METABOLIC PANEL
BUN: 15 (ref 4–21)
Creatinine: 0.9 (ref 0.5–1.1)
Glucose: 82
Potassium: 4.3 (ref 3.4–5.3)
Sodium: 138 (ref 137–147)

## 2019-08-15 LAB — HM PAP SMEAR: HM Pap smear: NEGATIVE

## 2019-08-15 LAB — LIPID PANEL
Cholesterol: 181 (ref 0–200)
HDL: 70 (ref 35–70)
LDL Cholesterol: 102
LDl/HDL Ratio: 1.5
Triglycerides: 42 (ref 40–160)

## 2019-08-15 LAB — HEMOGLOBIN A1C: Hemoglobin A1C: 5.3

## 2019-08-15 LAB — HEPATIC FUNCTION PANEL
ALT: 11 (ref 7–35)
AST: 15 (ref 13–35)
Alkaline Phosphatase: 58 (ref 25–125)

## 2019-08-15 LAB — COMPREHENSIVE METABOLIC PANEL
Albumin: 4.6 (ref 3.5–5.0)
Calcium: 9.4 (ref 8.7–10.7)
GFR calc Af Amer: 92
GFR calc non Af Amer: 80

## 2019-08-15 LAB — TSH: TSH: 1.5 (ref 0.41–5.90)

## 2019-08-15 LAB — VITAMIN D 25 HYDROXY (VIT D DEFICIENCY, FRACTURES): Vit D, 25-Hydroxy: 23

## 2019-08-15 LAB — HM MAMMOGRAPHY

## 2019-08-31 ENCOUNTER — Encounter: Payer: Self-pay | Admitting: Family Medicine

## 2019-09-07 ENCOUNTER — Encounter: Payer: BLUE CROSS/BLUE SHIELD | Admitting: Family Medicine

## 2020-08-13 IMAGING — CT CT CERVICAL SPINE W/O CM
5 of 8 series · 12 of 33 positions shown, 13 images · non-contrast
Comparison: None.

CLINICAL DATA: Syncopal episode, fall with head injury. C-collar in
place.

EXAM:
CT HEAD WITHOUT CONTRAST
CT CERVICAL SPINE WITHOUT CONTRAST
TECHNIQUE: Multidetector CT imaging of the head and cervical spine was
performed following the standard protocol without intravenous
contrast. Multiplanar CT image reconstructions of the cervical spine
were also generated.

[Series 5: head bone · axial · 0.44mm/px · z∈[-57,-5]mm · 2 of 78 slices shown]
[im 26/78  bone]
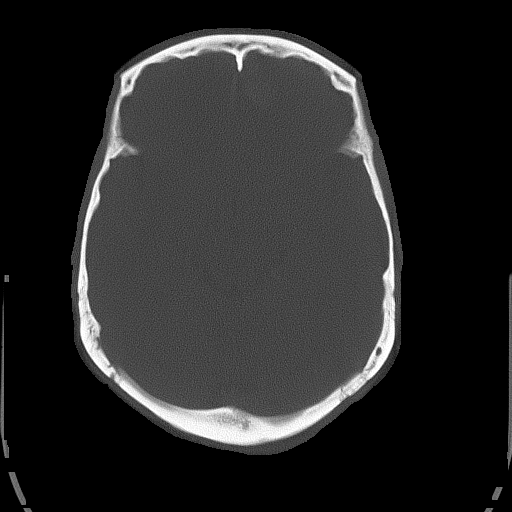
[im 52/78  bone]
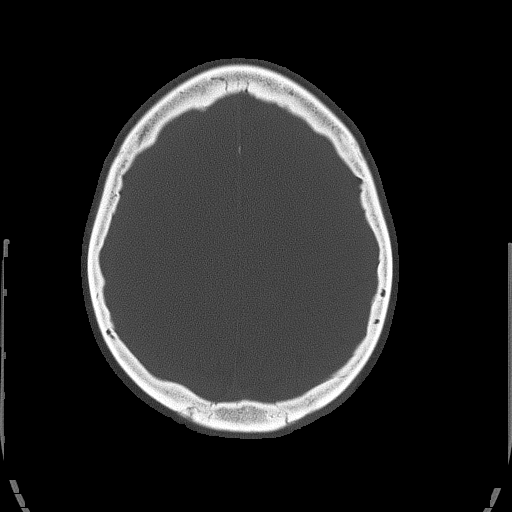

[Series 8: c_spine 2.0 st · axial · 0.29mm/px · z∈[-164,-112]mm · 2 of 78 slices shown, 3 images]
[im 26/78  soft-tissue]
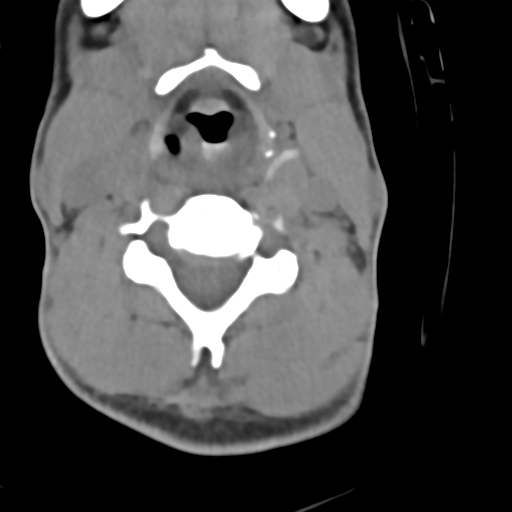
[im 26/78  bone]
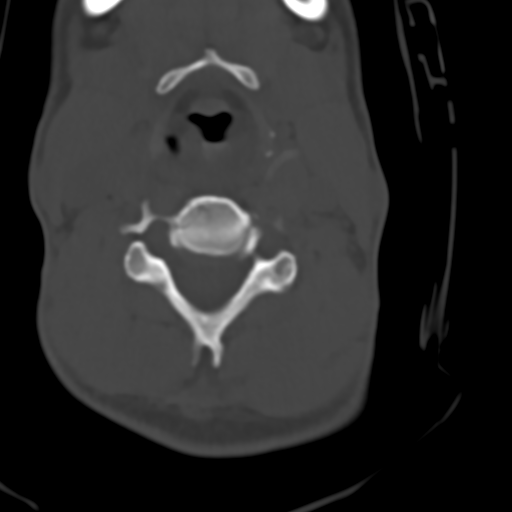
[im 52/78  bone]
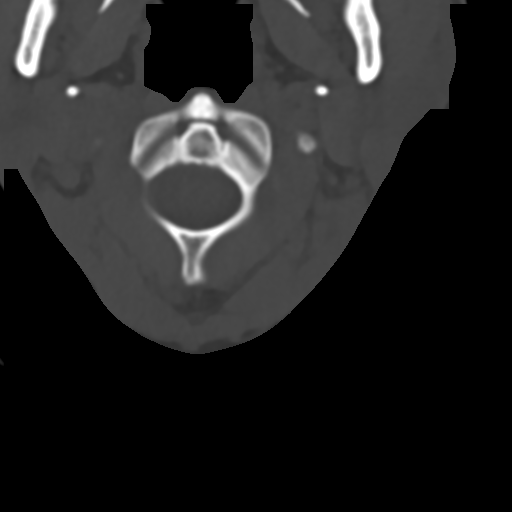

[Series 10: c_spine 2.0 sag bone · sagittal · 0.23mm/px · 5 of 61 slices shown]
[im 11/61  bone]
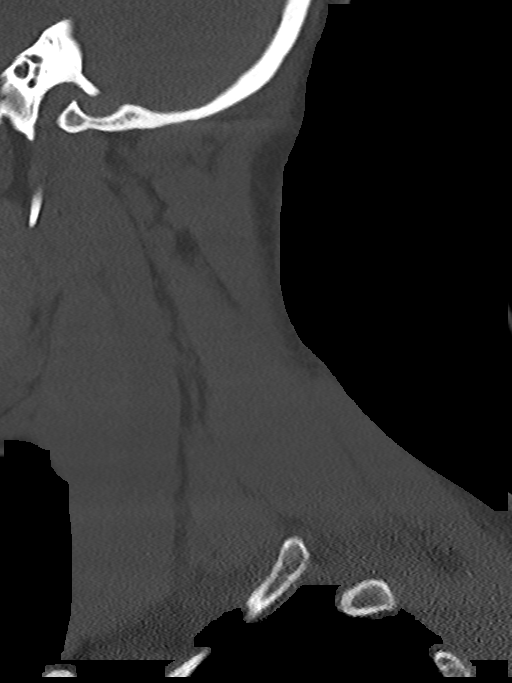
[im 21/61  bone]
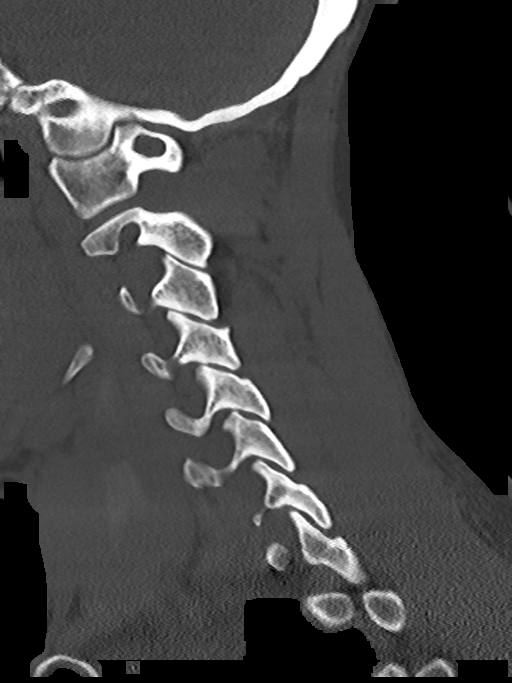
[im 31/61  bone]
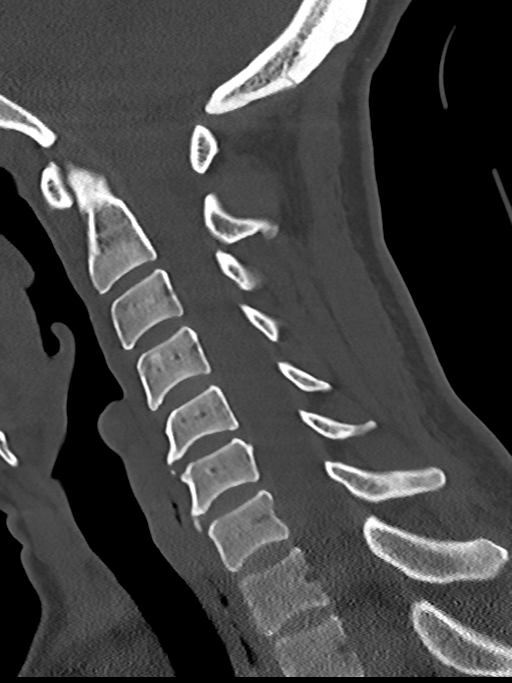
[im 41/61  bone]
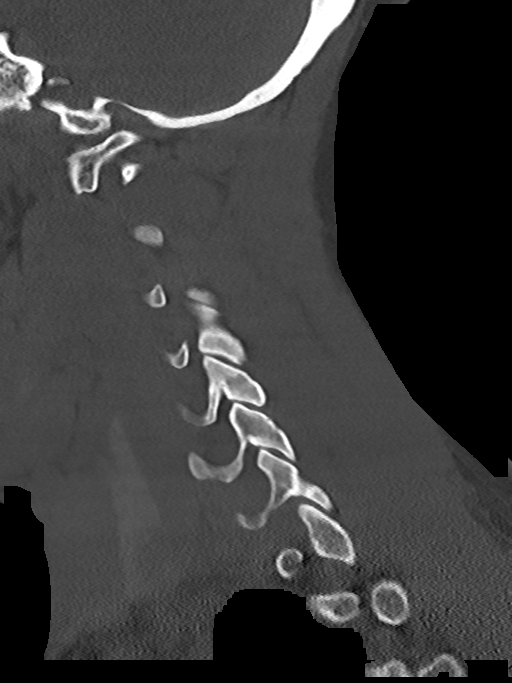
[im 51/61  bone]
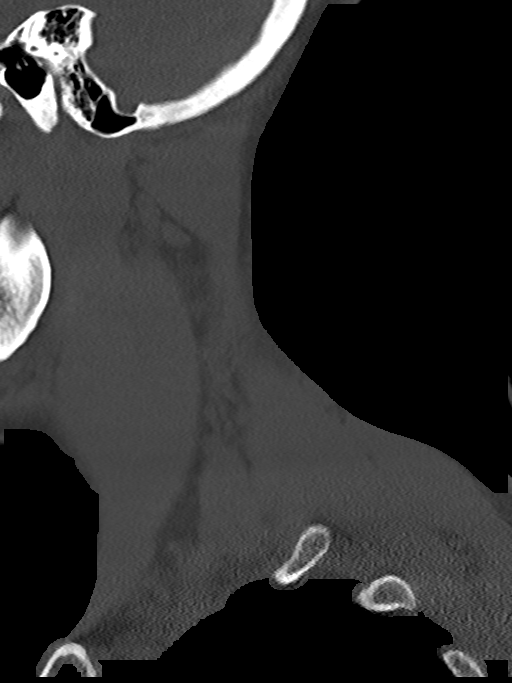

[Series 11: c_spine 2.0 cor bone · coronal · 0.23mm/px · 1 of 61 slices shown]
[im 31/61  bone]
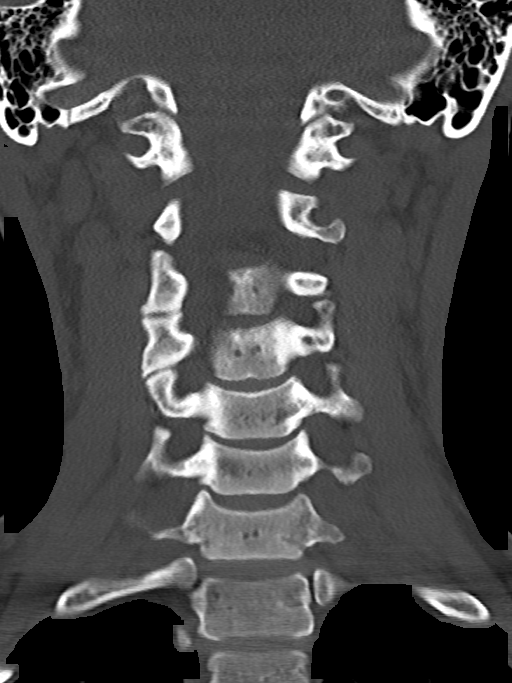

[Series 12: c_spine 2.0 orthogonals · axial · 0.21mm/px · z∈[-183,-141]mm · 2 of 71 slices shown]
[im 24/71  bone]
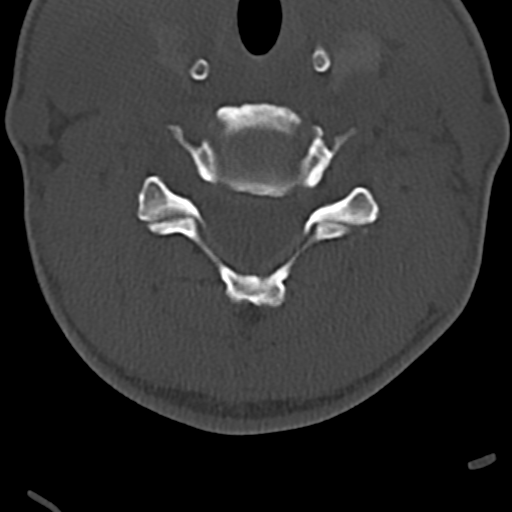
[im 47/71  bone]
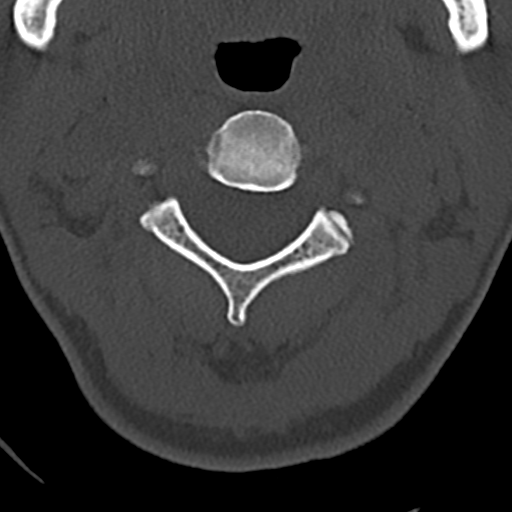

[12 of 33 positions shown; findings below may reference images not displayed]

FINDINGS: CT HEAD FINDINGS

Brain: Acute parenchymal hemorrhage within the lower aspects of the
LEFT frontal lobe, consistent with hemorrhagic contusion, dominant
component measuring 2 cm greatest dimension. Smaller amount of acute
parenchymal hemorrhage within the lower portion of the RIGHT frontal
lobe. Associated small amount of subarachnoid hemorrhage within the
frontal lobes, again likely posttraumatic.

Thin extra-axial hemorrhage is seen along the adjacent anterior
falx, measuring no more than 3 mm greatest thickness, without
associated mass effect.

At least mild associated edema within the lower frontal lobes
bilaterally, suspected to be most significant within the inferior
portion of the RIGHT frontal lobe, but no midline shift seen.

Ventricles are within normal limits in size and configuration.

Vascular: No hyperdense vessel or unexpected calcification.

Skull: No osseous fracture or dislocation.

Sinuses/Orbits: No acute finding.

Other: Scalp edema/hematoma overlying the LEFT occipital bone,
extending to the midline. No underlying fracture identified.

CT CERVICAL SPINE FINDINGS

Alignment: Normal.  No vertebral body subluxation.

Skull base and vertebrae: No fracture line or displaced fracture
fragment seen. Facet joints appear intact and normally aligned
throughout.

Soft tissues and spinal canal: No prevertebral fluid or swelling. No
visible canal hematoma.

Disc levels:  Disc spaces are well preserved throughout.

Upper chest: Negative.

Other: None.
IMPRESSION: 1. Hemorrhagic contusions within the lower bifrontal lobes, largest
component within the lower LEFT frontal lobe measuring 2 cm, with
associated small amount of traumatic subarachnoid hemorrhage.
Probable associated parenchymal edema, but no midline shift or
herniation.
2. Thin extra-axial hemorrhage along the adjacent anterior falx,
likely subdural, measuring 3 mm greatest thickness, without
significant mass effect.
3. Scalp edema/hematoma overlying the LEFT occipital bone, extending
to the midline. No underlying skull fracture identified. The
intracranial hemorrhage, therefore, is indicative of contrecoup
injury.
4. No fracture or acute subluxation within the cervical spine.

Critical Value/emergent results were called by telephone at the time
of interpretation on 11/12/2017 at [DATE] to Dr. Locklear, who
verbally acknowledged these results.
